# Patient Record
Sex: Female | Born: 1950
Health system: Southern US, Community
[De-identification: ages and names within clinical notes are randomized; demographics above are authoritative.]

## PROBLEM LIST (undated history)

## (undated) DIAGNOSIS — J309 Allergic rhinitis, unspecified: Secondary | ICD-10-CM

## (undated) DIAGNOSIS — I10 Essential (primary) hypertension: Secondary | ICD-10-CM

## (undated) DIAGNOSIS — E785 Hyperlipidemia, unspecified: Secondary | ICD-10-CM

## (undated) DIAGNOSIS — N183 Chronic kidney disease, stage 3 unspecified: Secondary | ICD-10-CM

## (undated) DIAGNOSIS — K219 Gastro-esophageal reflux disease without esophagitis: Secondary | ICD-10-CM

## (undated) DIAGNOSIS — F411 Generalized anxiety disorder: Secondary | ICD-10-CM

## (undated) DIAGNOSIS — E7800 Pure hypercholesterolemia, unspecified: Secondary | ICD-10-CM

## (undated) HISTORY — DX: Pure hypercholesterolemia, unspecified: E78.00

## (undated) HISTORY — DX: Allergic rhinitis, unspecified: J30.9

## (undated) HISTORY — DX: Gastro-esophageal reflux disease without esophagitis: K21.9

## (undated) HISTORY — DX: Generalized anxiety disorder: F41.1

## (undated) HISTORY — DX: Chronic kidney disease, stage 3 unspecified: N18.30

---

## 1998-12-04 ENCOUNTER — Encounter: Admission: RE | Admit: 1998-12-04 | Discharge: 1998-12-04 | Payer: Self-pay | Admitting: Family Medicine

## 1998-12-04 ENCOUNTER — Encounter: Payer: Self-pay | Admitting: Family Medicine

## 1999-11-27 ENCOUNTER — Other Ambulatory Visit: Admission: RE | Admit: 1999-11-27 | Discharge: 1999-11-27 | Payer: Self-pay | Admitting: Obstetrics & Gynecology

## 2014-05-06 ENCOUNTER — Other Ambulatory Visit: Payer: Self-pay | Admitting: Physician Assistant

## 2014-05-08 LAB — CYTOLOGY - PAP

## 2014-08-24 ENCOUNTER — Encounter (HOSPITAL_COMMUNITY): Payer: Self-pay | Admitting: *Deleted

## 2014-08-24 ENCOUNTER — Emergency Department (HOSPITAL_COMMUNITY)
Admission: EM | Admit: 2014-08-24 | Discharge: 2014-08-24 | Disposition: A | Discharge status: Home / Self Care | Payer: BC Managed Care – PPO | Attending: Emergency Medicine | Admitting: Emergency Medicine

## 2014-08-24 ENCOUNTER — Emergency Department (HOSPITAL_COMMUNITY): Payer: BC Managed Care – PPO

## 2014-08-24 DIAGNOSIS — E785 Hyperlipidemia, unspecified: Secondary | ICD-10-CM | POA: Diagnosis not present

## 2014-08-24 DIAGNOSIS — Z792 Long term (current) use of antibiotics: Secondary | ICD-10-CM | POA: Diagnosis not present

## 2014-08-24 DIAGNOSIS — R1030 Lower abdominal pain, unspecified: Secondary | ICD-10-CM | POA: Diagnosis present

## 2014-08-24 DIAGNOSIS — Z87440 Personal history of urinary (tract) infections: Secondary | ICD-10-CM | POA: Insufficient documentation

## 2014-08-24 DIAGNOSIS — G8929 Other chronic pain: Secondary | ICD-10-CM | POA: Insufficient documentation

## 2014-08-24 DIAGNOSIS — I10 Essential (primary) hypertension: Secondary | ICD-10-CM | POA: Diagnosis not present

## 2014-08-24 DIAGNOSIS — K5732 Diverticulitis of large intestine without perforation or abscess without bleeding: Secondary | ICD-10-CM

## 2014-08-24 DIAGNOSIS — R350 Frequency of micturition: Secondary | ICD-10-CM | POA: Diagnosis not present

## 2014-08-24 DIAGNOSIS — Z79899 Other long term (current) drug therapy: Secondary | ICD-10-CM | POA: Diagnosis not present

## 2014-08-24 HISTORY — DX: Hyperlipidemia, unspecified: E78.5

## 2014-08-24 HISTORY — DX: Essential (primary) hypertension: I10

## 2014-08-24 LAB — COMPREHENSIVE METABOLIC PANEL
ALT: 16 U/L (ref 14–54)
AST: 23 U/L (ref 15–41)
Albumin: 4 g/dL (ref 3.5–5.0)
Alkaline Phosphatase: 60 U/L (ref 38–126)
Anion gap: 7 (ref 5–15)
BUN: 16 mg/dL (ref 6–20)
CALCIUM: 8.8 mg/dL — AB (ref 8.9–10.3)
CO2: 25 mmol/L (ref 22–32)
CREATININE: 1.19 mg/dL — AB (ref 0.44–1.00)
Chloride: 100 mmol/L — ABNORMAL LOW (ref 101–111)
GFR calc non Af Amer: 47 mL/min — ABNORMAL LOW (ref >60)
GFR, EST AFRICAN AMERICAN: 55 mL/min — AB (ref >60)
Glucose, Bld: 120 mg/dL — ABNORMAL HIGH (ref 65–99)
Potassium: 3.3 mmol/L — ABNORMAL LOW (ref 3.5–5.1)
Sodium: 132 mmol/L — ABNORMAL LOW (ref 135–145)
Total Bilirubin: 1.1 mg/dL (ref 0.3–1.2)
Total Protein: 7.5 g/dL (ref 6.5–8.1)

## 2014-08-24 LAB — CBC WITH DIFFERENTIAL/PLATELET
BASOS PCT: 0 % (ref 0–1)
Basophils Absolute: 0 10*3/uL (ref 0.0–0.1)
Eosinophils Absolute: 0.1 10*3/uL (ref 0.0–0.7)
Eosinophils Relative: 1 % (ref 0–5)
HEMATOCRIT: 38.1 % (ref 36.0–46.0)
HEMOGLOBIN: 13.1 g/dL (ref 12.0–15.0)
LYMPHS ABS: 0.9 10*3/uL (ref 0.7–4.0)
Lymphocytes Relative: 8 % — ABNORMAL LOW (ref 12–46)
MCH: 29.5 pg (ref 26.0–34.0)
MCHC: 34.4 g/dL (ref 30.0–36.0)
MCV: 85.8 fL (ref 78.0–100.0)
MONOS PCT: 7 % (ref 3–12)
Monocytes Absolute: 0.7 10*3/uL (ref 0.1–1.0)
NEUTROS ABS: 8.8 10*3/uL — AB (ref 1.7–7.7)
Neutrophils Relative %: 84 % — ABNORMAL HIGH (ref 43–77)
Platelets: 169 10*3/uL (ref 150–400)
RBC: 4.44 MIL/uL (ref 3.87–5.11)
RDW: 13.5 % (ref 11.5–15.5)
WBC: 10.5 10*3/uL (ref 4.0–10.5)

## 2014-08-24 LAB — URINALYSIS, ROUTINE W REFLEX MICROSCOPIC
Bilirubin Urine: NEGATIVE
Glucose, UA: NEGATIVE mg/dL
HGB URINE DIPSTICK: NEGATIVE
KETONES UR: NEGATIVE mg/dL
LEUKOCYTES UA: NEGATIVE
NITRITE: NEGATIVE
Protein, ur: NEGATIVE mg/dL
Specific Gravity, Urine: 1.025 (ref 1.005–1.030)
Urobilinogen, UA: 0.2 mg/dL (ref 0.0–1.0)
pH: 6 (ref 5.0–8.0)

## 2014-08-24 MED ORDER — CIPROFLOXACIN HCL 500 MG PO TABS: 500.0000 mg | ORAL_TABLET | Freq: Two times a day (BID) | ORAL | Status: DC

## 2014-08-24 MED ORDER — METRONIDAZOLE 500 MG PO TABS
500.0000 mg | ORAL_TABLET | Freq: Two times a day (BID) | ORAL | Status: DC
Start: 2014-08-24 — End: 2021-04-17

## 2014-08-24 MED ORDER — SODIUM CHLORIDE 0.9 % IV BOLUS (SEPSIS)
1000.0000 mL | Freq: Once | INTRAVENOUS | Status: AC
  Administered 2014-08-24: 1000 mL via INTRAVENOUS

## 2014-08-24 MED ORDER — ONDANSETRON 4 MG PO TBDP: ORAL_TABLET | ORAL | Status: DC

## 2014-08-24 NOTE — Discharge Instructions (Signed)

## 2014-08-24 NOTE — ED Provider Notes (Signed)
CSN: [id number]     Arrival date & time 08/24/14  4098 History   First MD Initiated Contact with Patient 08/24/14 [id number]     Chief Complaint  Patient presents with  . Abdominal Pain     (Consider location/radiation/quality/duration/timing/severity/associated sxs/prior Treatment) HPI Comments: [name] She was seen by her PCP on Thursday which was 2 days ago. She was diagnosed with a urinary tract infection and was started on Cipro. She reports no improvement of symptoms. She states her symptoms initially started as urinary frequency. She denies any burning on urination. She denies any vaginal bleeding or discharge. She is postmenopausal. She states that her urinary frequency has improved but her lower abdominal pain has increased in intensity. It now radiates across both sides. She has some mild pain in her lower back but she feels that it's more chronic in nature. She denies any nausea vomiting or fevers. She denies any past surgical history.  [name]  Abdominal Pain Associated symptoms: no chest pain, no chills, no cough, no diarrhea, no fatigue, no fever, no hematuria, no nausea, no shortness of breath, no vaginal bleeding, no vaginal discharge and no vomiting     Past Medical History  Diagnosis Date  . Hypertension   . Hyperlipemia    History reviewed. No pertinent past surgical history. No family history on file. History  Substance Use Topics  . Smoking status: Never Smoker   . Smokeless tobacco: Not on file  . Alcohol Use: Yes     Comment: rarely   OB History    No data available     Review of Systems  Constitutional: Negative for fever, chills, diaphoresis and fatigue.  HENT: Negative for congestion, rhinorrhea and sneezing.   Eyes: Negative.   Respiratory: Negative for cough, chest tightness and shortness of breath.   Cardiovascular: Negative  for chest pain and leg swelling.  Gastrointestinal: Positive for abdominal pain. Negative for nausea, vomiting, diarrhea and blood in stool.  Genitourinary: Positive for frequency. Negative for hematuria, flank pain, vaginal bleeding, vaginal discharge and difficulty urinating.  Musculoskeletal: Negative for back pain and arthralgias.  Skin: Negative for rash.  Neurological: Negative for dizziness, speech difficulty, weakness, numbness and headaches.      Allergies  Review of patient's allergies indicates no known allergies.  Home Medications   Prior to Admission medications   Medication Sig Start Date End Date Taking? Authorizing Provider  atenolol (TENORMIN) 50 MG tablet Take 50 mg by mouth every morning.   Yes Historical Provider, MD  atorvastatin (LIPITOR) 20 MG tablet Take 20 mg by mouth daily. 06/06/14  Yes Historical Provider, MD  citalopram (CELEXA) 20 MG tablet Take 20 mg by mouth every morning.  07/10/14  Yes Historical Provider, MD  lisinopril (PRINIVIL,ZESTRIL) 10 MG tablet Take 10 mg by mouth every morning.  06/04/14  Yes Historical Provider, MD  pantoprazole (PROTONIX) 40 MG tablet Take 40 mg by mouth daily as needed (for heartburn).  08/22/14  Yes Historical Provider, MD  sulfamethoxazole-trimethoprim (BACTRIM DS,SEPTRA DS) 800-160 MG per tablet Take 1 tablet by mouth 2 (two) times daily.  08/22/14  Yes Historical Provider, MD  ciprofloxacin (CIPRO) 500 MG tablet Take 1 tablet (500 mg total) by mouth 2 (two) times daily. One po bid x 7 days 08/24/14   Rolan Bucco, MD  metroNIDAZOLE (FLAGYL) 500 MG tablet Take 1 tablet (500 mg total) by mouth 2 (  two) times daily. One po bid x 7 days 08/24/14   Rolan Bucco, MD  ondansetron (ZOFRAN ODT) 4 MG disintegrating tablet 4mg  ODT q4 hours prn nausea/vomit 08/24/14   Rolan Bucco, MD   BP 131/80 mmHg  Pulse 75  Temp(Src) 98.1 F (36.7 C) (Oral)  Resp 14  SpO2 98% Physical Exam  Constitutional: She is oriented to person, place, and time.  She appears well-developed and well-nourished.  HENT:  Head: Normocephalic and atraumatic.  Eyes: Pupils are equal, round, and reactive to light.  Neck: Normal range of motion. Neck supple.  Cardiovascular: Normal rate, regular rhythm and normal heart sounds.   Pulmonary/Chest: Effort normal and breath sounds normal. No respiratory distress. She has no wheezes. She has no rales. She exhibits no tenderness.  Abdominal: Soft. Bowel sounds are normal. There is tenderness (moderate tenderness to suprapubic area with mild tenderness across the lower abdomen bilaterally, no CVA tenderness). There is no rebound and no guarding.  Musculoskeletal: Normal range of motion. She exhibits no edema.  Lymphadenopathy:    She has no cervical adenopathy.  Neurological: She is alert and oriented to person, place, and time.  Skin: Skin is warm and dry. No rash noted.  Psychiatric: She has a normal mood and affect.    ED Course  Procedures (including critical care time) Labs Review Labs Reviewed  COMPREHENSIVE METABOLIC PANEL - Abnormal; Notable for the following:    Sodium 132 (*)    Potassium 3.3 (*)    Chloride 100 (*)    Glucose, Bld 120 (*)    Creatinine, Ser 1.19 (*)    Calcium 8.8 (*)    GFR calc non Af Amer 47 (*)    GFR calc Af Amer 55 (*)    All other components within normal limits  CBC WITH DIFFERENTIAL/PLATELET - Abnormal; Notable for the following:    Neutrophils Relative % 84 (*)    Neutro Abs 8.8 (*)    Lymphocytes Relative 8 (*)    All other components within normal limits  URINE CULTURE  URINALYSIS, ROUTINE W REFLEX MICROSCOPIC (NOT AT [facility])    Imaging Review Ct Renal Stone Study  08/24/2014   CLINICAL DATA:  Recent UTI with persistent urgency  EXAM: CT ABDOMEN AND PELVIS WITHOUT CONTRAST  TECHNIQUE: Multidetector CT imaging of the abdomen and pelvis was performed following the standard protocol without IV contrast.  COMPARISON:  None.  FINDINGS: Lung bases are free of acute  infiltrate or sizable effusion.  The liver, gallbladder, spleen, adrenal glands and pancreas are within normal limits. The kidneys are well visualized bilaterally without renal calculi or obstructive change. The bladder is decompressed.  Prominent left ovarian cyst is noted measuring 4.6 cm. The appendix is within normal limits. No free pelvic fluid is noted.  Significant inflammatory changes are noted surrounding the sigmoid colon with evidence of diverticular change. No focal perforation is identified. These changes are consistent with diverticulitis.  IMPRESSION: Left ovarian cyst.  Diverticulitis in the distal sigmoid colon.   Electronically Signed   By: Alcide Clever M.D.   On: 08/24/2014 09:43     EKG Interpretation None      MDM   Final diagnoses:  Diverticulitis of large intestine without perforation or abscess without bleeding    Patient presents with abdominal pain. Her urine does not look infected. There is no kidney stone. She does have evidence of diverticulitis. I will start her on Cipro and Flagyl as well as some Zofran for nausea. She's  afebrile and otherwise well-appearing. She does have some elevation in her creatinine and diminished kidney function however she says this is chronic. She does states a little bit worse than her normal as her GFR was last 59. She has some mild hyponatremia and hypokalemia. I encouraged her to have a follow-up visit this week with her primary care physician. Return cautions were given.    Rolan Bucco, MD 08/24/14 1024

## 2014-08-24 NOTE — ED Notes (Signed)
Pt started on cipro Thurs night for UTI Dx by PCP. Sts lower abd pain since Thursday as well. Frequency and urgency have resolved but not the pain.

## 2014-08-25 LAB — URINE CULTURE: Culture: 3000

## 2015-06-16 ENCOUNTER — Other Ambulatory Visit: Payer: Self-pay | Admitting: Physician Assistant

## 2016-02-12 ENCOUNTER — Other Ambulatory Visit: Payer: Self-pay | Admitting: Physician Assistant

## 2016-02-12 DIAGNOSIS — R5381 Other malaise: Secondary | ICD-10-CM

## 2016-02-16 ENCOUNTER — Other Ambulatory Visit: Payer: Self-pay | Admitting: Physician Assistant

## 2016-02-16 DIAGNOSIS — Z1231 Encounter for screening mammogram for malignant neoplasm of breast: Secondary | ICD-10-CM

## 2016-02-16 DIAGNOSIS — E2839 Other primary ovarian failure: Secondary | ICD-10-CM

## 2017-05-25 ENCOUNTER — Ambulatory Visit (INDEPENDENT_AMBULATORY_CARE_PROVIDER_SITE_OTHER): Payer: BC Managed Care – PPO

## 2017-05-25 ENCOUNTER — Ambulatory Visit: Payer: BC Managed Care – PPO | Admitting: Podiatry

## 2017-05-25 ENCOUNTER — Encounter: Payer: Self-pay | Admitting: Podiatry

## 2017-05-25 DIAGNOSIS — M21619 Bunion of unspecified foot: Secondary | ICD-10-CM | POA: Diagnosis not present

## 2017-05-25 DIAGNOSIS — M7752 Other enthesopathy of left foot: Secondary | ICD-10-CM

## 2017-05-25 DIAGNOSIS — M779 Enthesopathy, unspecified: Secondary | ICD-10-CM

## 2017-05-25 MED ORDER — TRIAMCINOLONE ACETONIDE 10 MG/ML IJ SUSP
10.0000 mg | Freq: Once | INTRAMUSCULAR | Status: AC
  Administered 2017-05-25: 10 mg

## 2017-05-25 NOTE — Patient Instructions (Signed)

## 2017-05-26 NOTE — Progress Notes (Signed)
Subjective:   Patient ID: Robyn Hall, female   DOB: 67 y.o.   MRN: [id number]   HPI Patient presents stating she has had a chronic bunion deformity left which is been sore and is been going on for at least the last year making increasingly hard procedure comfortably.  Patient has tried shoe gear modification without relief along with soaks and patient currently does not smoke and likes to be active   Review of Systems  All other systems reviewed and are negative.       Objective:  Physical Exam  Constitutional: She appears well-developed and well-nourished.  Cardiovascular: Intact distal pulses.  Pulmonary/Chest: Effort normal.  Musculoskeletal: Normal range of motion.  Neurological: She is alert.  Skin: Skin is warm.  Nursing note and vitals reviewed.   Neurovascular status found to be intact muscle strength is adequate range of motion within normal limits with patient found to have redness and pain around the first metatarsal head left with fluid buildup around the joint surface and pain with palpation.  Patient is noted to have good digital perfusion is well oriented x3     Assessment:  Inflammatory bunion deformity left with capsulitis occurring around the joint with pain upon palpation and structural changes     Plan:  H&P x-rays reviewed and today I did inject the capsule 3 mg Kenalog 5 mg Xylocaine advised on wider shoes and soaks and instructed on reduced activity levels currently.  Discussed permanent correction long-term with structural correction of the deformity she wants this done but cannot do now due to her work schedule.  Patient will be seen back in the next several weeks to reevaluate see how she responded to injection treatment  X-ray indicates structural bunion deformity left with elevation of the intermetatarsal angle and large bump formation

## 2018-09-11 ENCOUNTER — Other Ambulatory Visit: Payer: Self-pay

## 2018-09-11 DIAGNOSIS — Z20822 Contact with and (suspected) exposure to covid-19: Secondary | ICD-10-CM

## 2018-09-12 LAB — NOVEL CORONAVIRUS, NAA: SARS-CoV-2, NAA: NOT DETECTED

## 2018-10-05 ENCOUNTER — Other Ambulatory Visit: Payer: Self-pay | Admitting: Physician Assistant

## 2018-10-05 DIAGNOSIS — J3489 Other specified disorders of nose and nasal sinuses: Secondary | ICD-10-CM

## 2018-10-12 ENCOUNTER — Ambulatory Visit
Admission: RE | Admit: 2018-10-12 | Discharge: 2018-10-12 | Disposition: A | Discharge status: Home / Self Care | Payer: Medicare Other | Source: Ambulatory Visit | Attending: Physician Assistant | Admitting: Physician Assistant

## 2018-10-12 ENCOUNTER — Other Ambulatory Visit: Payer: Self-pay

## 2018-10-12 DIAGNOSIS — J3489 Other specified disorders of nose and nasal sinuses: Secondary | ICD-10-CM

## 2018-11-02 DIAGNOSIS — H6993 Unspecified Eustachian tube disorder, bilateral: Secondary | ICD-10-CM

## 2018-11-02 DIAGNOSIS — H6983 Other specified disorders of Eustachian tube, bilateral: Secondary | ICD-10-CM | POA: Insufficient documentation

## 2018-11-02 HISTORY — DX: Unspecified eustachian tube disorder, bilateral: H69.93

## 2018-11-02 HISTORY — DX: Other specified disorders of eustachian tube, bilateral: H69.83

## 2018-12-22 ENCOUNTER — Other Ambulatory Visit: Payer: Self-pay

## 2018-12-22 DIAGNOSIS — Z20822 Contact with and (suspected) exposure to covid-19: Secondary | ICD-10-CM

## 2018-12-25 LAB — NOVEL CORONAVIRUS, NAA: SARS-CoV-2, NAA: NOT DETECTED

## 2019-03-12 ENCOUNTER — Ambulatory Visit: Payer: Self-pay

## 2019-03-12 ENCOUNTER — Ambulatory Visit: Payer: Medicare PPO | Attending: Internal Medicine

## 2019-03-12 DIAGNOSIS — Z23 Encounter for immunization: Secondary | ICD-10-CM | POA: Insufficient documentation

## 2019-03-12 NOTE — Progress Notes (Signed)
   Covid-19 Vaccination Clinic  Name:  Robyn Hall    MRN: [id number] DOB: 07-11-1950  03/12/2019  Robyn Hall was observed post Covid-19 immunization for 15 minutes without incidence. She was provided with Vaccine Information Sheet and instruction to access the V-Safe system.   Robyn Hall was instructed to call 911 with any severe reactions post vaccine: Marland Kitchen Difficulty breathing  . Swelling of your face and throat  . A fast heartbeat  . A bad rash all over your body  . Dizziness and weakness    Immunizations Administered    Name Date Dose VIS Date Route   Pfizer COVID-19 Vaccine 03/12/2019  6:12 PM 0.3 mL 01/12/2019 Intramuscular   Manufacturer: [employer], [employer]   Lot: [id number]   NDC: [id number]

## 2019-04-06 ENCOUNTER — Ambulatory Visit: Payer: Medicare PPO | Attending: Physician Assistant

## 2019-04-06 DIAGNOSIS — Z23 Encounter for immunization: Secondary | ICD-10-CM

## 2019-04-06 NOTE — Progress Notes (Signed)
   Covid-19 Vaccination Clinic  Name:  Robyn Hall    MRN: [id number] DOB: 1950-09-24  04/06/2019  [name] was observed post Covid-19 immunization for 15 minutes without incident. She was provided with Vaccine Information Sheet and instruction to access the V-Safe system.   [name] was instructed to call 911 with any severe reactions post vaccine: Marland Kitchen Difficulty breathing  . Swelling of face and throat  . A fast heartbeat  . A bad rash all over body  . Dizziness and weakness   Immunizations Administered    Name Date Dose VIS Date Route   Pfizer COVID-19 Vaccine 04/06/2019  6:19 PM 0.3 mL 01/12/2019 Intramuscular   Manufacturer: [employer], [employer]   Lot: [id number]   NDC: [address]-2

## 2019-05-22 DIAGNOSIS — C441191 Basal cell carcinoma of skin of left upper eyelid, including canthus: Secondary | ICD-10-CM | POA: Diagnosis not present

## 2019-06-08 DIAGNOSIS — L244 Irritant contact dermatitis due to drugs in contact with skin: Secondary | ICD-10-CM | POA: Diagnosis not present

## 2019-06-08 DIAGNOSIS — L905 Scar conditions and fibrosis of skin: Secondary | ICD-10-CM | POA: Diagnosis not present

## 2019-06-08 DIAGNOSIS — Z85828 Personal history of other malignant neoplasm of skin: Secondary | ICD-10-CM | POA: Diagnosis not present

## 2019-07-17 DIAGNOSIS — L814 Other melanin hyperpigmentation: Secondary | ICD-10-CM | POA: Diagnosis not present

## 2019-07-17 DIAGNOSIS — L821 Other seborrheic keratosis: Secondary | ICD-10-CM | POA: Diagnosis not present

## 2019-07-17 DIAGNOSIS — L810 Postinflammatory hyperpigmentation: Secondary | ICD-10-CM | POA: Diagnosis not present

## 2019-07-17 DIAGNOSIS — Z872 Personal history of diseases of the skin and subcutaneous tissue: Secondary | ICD-10-CM | POA: Diagnosis not present

## 2019-07-19 DIAGNOSIS — R399 Unspecified symptoms and signs involving the genitourinary system: Secondary | ICD-10-CM | POA: Diagnosis not present

## 2019-07-24 DIAGNOSIS — L728 Other follicular cysts of the skin and subcutaneous tissue: Secondary | ICD-10-CM | POA: Diagnosis not present

## 2019-08-23 DIAGNOSIS — Z13820 Encounter for screening for osteoporosis: Secondary | ICD-10-CM | POA: Diagnosis not present

## 2019-08-23 DIAGNOSIS — K219 Gastro-esophageal reflux disease without esophagitis: Secondary | ICD-10-CM | POA: Diagnosis not present

## 2019-08-23 DIAGNOSIS — E7800 Pure hypercholesterolemia, unspecified: Secondary | ICD-10-CM | POA: Diagnosis not present

## 2019-08-23 DIAGNOSIS — I129 Hypertensive chronic kidney disease with stage 1 through stage 4 chronic kidney disease, or unspecified chronic kidney disease: Secondary | ICD-10-CM | POA: Diagnosis not present

## 2019-08-23 DIAGNOSIS — Z0000 Encounter for general adult medical examination without abnormal findings: Secondary | ICD-10-CM | POA: Diagnosis not present

## 2019-08-23 DIAGNOSIS — Z1239 Encounter for other screening for malignant neoplasm of breast: Secondary | ICD-10-CM | POA: Diagnosis not present

## 2019-08-23 DIAGNOSIS — N1831 Chronic kidney disease, stage 3a: Secondary | ICD-10-CM | POA: Diagnosis not present

## 2019-08-23 DIAGNOSIS — Z1159 Encounter for screening for other viral diseases: Secondary | ICD-10-CM | POA: Diagnosis not present

## 2019-08-23 DIAGNOSIS — F419 Anxiety disorder, unspecified: Secondary | ICD-10-CM | POA: Diagnosis not present

## 2019-09-06 DIAGNOSIS — L728 Other follicular cysts of the skin and subcutaneous tissue: Secondary | ICD-10-CM | POA: Diagnosis not present

## 2019-09-17 DIAGNOSIS — Z20822 Contact with and (suspected) exposure to covid-19: Secondary | ICD-10-CM | POA: Diagnosis not present

## 2019-09-24 DIAGNOSIS — Z20822 Contact with and (suspected) exposure to covid-19: Secondary | ICD-10-CM | POA: Diagnosis not present

## 2019-10-09 DIAGNOSIS — Z20822 Contact with and (suspected) exposure to covid-19: Secondary | ICD-10-CM | POA: Diagnosis not present

## 2019-10-29 DIAGNOSIS — H5319 Other subjective visual disturbances: Secondary | ICD-10-CM | POA: Diagnosis not present

## 2019-11-26 DIAGNOSIS — L821 Other seborrheic keratosis: Secondary | ICD-10-CM | POA: Diagnosis not present

## 2019-11-26 DIAGNOSIS — L578 Other skin changes due to chronic exposure to nonionizing radiation: Secondary | ICD-10-CM | POA: Diagnosis not present

## 2019-11-26 DIAGNOSIS — D1801 Hemangioma of skin and subcutaneous tissue: Secondary | ICD-10-CM | POA: Diagnosis not present

## 2019-11-26 DIAGNOSIS — L814 Other melanin hyperpigmentation: Secondary | ICD-10-CM | POA: Diagnosis not present

## 2020-01-29 DIAGNOSIS — H524 Presbyopia: Secondary | ICD-10-CM | POA: Diagnosis not present

## 2020-02-22 ENCOUNTER — Other Ambulatory Visit: Payer: Self-pay | Admitting: Physician Assistant

## 2020-02-22 DIAGNOSIS — Z1231 Encounter for screening mammogram for malignant neoplasm of breast: Secondary | ICD-10-CM

## 2020-02-22 DIAGNOSIS — E7800 Pure hypercholesterolemia, unspecified: Secondary | ICD-10-CM | POA: Diagnosis not present

## 2020-02-22 DIAGNOSIS — K219 Gastro-esophageal reflux disease without esophagitis: Secondary | ICD-10-CM | POA: Diagnosis not present

## 2020-02-22 DIAGNOSIS — I129 Hypertensive chronic kidney disease with stage 1 through stage 4 chronic kidney disease, or unspecified chronic kidney disease: Secondary | ICD-10-CM | POA: Diagnosis not present

## 2020-02-22 DIAGNOSIS — F419 Anxiety disorder, unspecified: Secondary | ICD-10-CM | POA: Diagnosis not present

## 2020-02-22 DIAGNOSIS — N183 Chronic kidney disease, stage 3 unspecified: Secondary | ICD-10-CM | POA: Diagnosis not present

## 2020-04-07 ENCOUNTER — Ambulatory Visit
Admission: RE | Admit: 2020-04-07 | Discharge: 2020-04-07 | Disposition: A | Discharge status: Home / Self Care | Payer: Medicare PPO | Source: Ambulatory Visit | Attending: Physician Assistant | Admitting: Physician Assistant

## 2020-04-07 ENCOUNTER — Other Ambulatory Visit: Payer: Self-pay

## 2020-04-07 DIAGNOSIS — Z1231 Encounter for screening mammogram for malignant neoplasm of breast: Secondary | ICD-10-CM

## 2020-09-24 DIAGNOSIS — H2513 Age-related nuclear cataract, bilateral: Secondary | ICD-10-CM | POA: Diagnosis not present

## 2020-10-07 DIAGNOSIS — Z0000 Encounter for general adult medical examination without abnormal findings: Secondary | ICD-10-CM | POA: Diagnosis not present

## 2020-10-07 DIAGNOSIS — Z23 Encounter for immunization: Secondary | ICD-10-CM | POA: Diagnosis not present

## 2020-10-07 DIAGNOSIS — E7800 Pure hypercholesterolemia, unspecified: Secondary | ICD-10-CM | POA: Diagnosis not present

## 2020-10-07 DIAGNOSIS — Z1211 Encounter for screening for malignant neoplasm of colon: Secondary | ICD-10-CM | POA: Diagnosis not present

## 2020-10-07 DIAGNOSIS — N183 Chronic kidney disease, stage 3 unspecified: Secondary | ICD-10-CM | POA: Diagnosis not present

## 2020-10-07 DIAGNOSIS — F419 Anxiety disorder, unspecified: Secondary | ICD-10-CM | POA: Diagnosis not present

## 2020-10-07 DIAGNOSIS — I129 Hypertensive chronic kidney disease with stage 1 through stage 4 chronic kidney disease, or unspecified chronic kidney disease: Secondary | ICD-10-CM | POA: Diagnosis not present

## 2020-10-07 DIAGNOSIS — K219 Gastro-esophageal reflux disease without esophagitis: Secondary | ICD-10-CM | POA: Diagnosis not present

## 2020-10-07 DIAGNOSIS — J309 Allergic rhinitis, unspecified: Secondary | ICD-10-CM | POA: Diagnosis not present

## 2020-10-09 DIAGNOSIS — Z01818 Encounter for other preprocedural examination: Secondary | ICD-10-CM | POA: Diagnosis not present

## 2020-10-09 DIAGNOSIS — H2512 Age-related nuclear cataract, left eye: Secondary | ICD-10-CM | POA: Diagnosis not present

## 2020-10-14 ENCOUNTER — Encounter: Payer: Self-pay | Admitting: Psychology

## 2020-10-31 DIAGNOSIS — H2512 Age-related nuclear cataract, left eye: Secondary | ICD-10-CM | POA: Diagnosis not present

## 2020-11-17 DIAGNOSIS — H2511 Age-related nuclear cataract, right eye: Secondary | ICD-10-CM | POA: Diagnosis not present

## 2020-11-19 ENCOUNTER — Ambulatory Visit (INDEPENDENT_AMBULATORY_CARE_PROVIDER_SITE_OTHER): Payer: Medicare PPO

## 2020-11-19 ENCOUNTER — Ambulatory Visit: Payer: Medicare PPO | Admitting: Podiatry

## 2020-11-19 ENCOUNTER — Encounter: Payer: Self-pay | Admitting: Podiatry

## 2020-11-19 ENCOUNTER — Other Ambulatory Visit: Payer: Self-pay

## 2020-11-19 DIAGNOSIS — M7752 Other enthesopathy of left foot: Secondary | ICD-10-CM | POA: Diagnosis not present

## 2020-11-19 DIAGNOSIS — M79672 Pain in left foot: Secondary | ICD-10-CM

## 2020-11-19 DIAGNOSIS — M21619 Bunion of unspecified foot: Secondary | ICD-10-CM

## 2020-11-19 DIAGNOSIS — M7750 Other enthesopathy of unspecified foot: Secondary | ICD-10-CM

## 2020-11-19 MED ORDER — TRIAMCINOLONE ACETONIDE 10 MG/ML IJ SUSP
10.0000 mg | Freq: Once | INTRAMUSCULAR | Status: AC
  Administered 2020-11-19: 10 mg

## 2020-11-19 NOTE — Progress Notes (Signed)
Subjective:   Patient ID: Robyn Hall, female   DOB: 70 y.o.   MRN: [id number]   HPI Patient presents stating she is developed a lot of pain on top of her left foot and its been very sore and making it hard to walk with swelling.  Does not remember specific injury states its been present around a month and does have bunion deformity.  Patient does not smoke likes to be active   Review of Systems  All other systems reviewed and are negative.      Objective:  Physical Exam Vitals and nursing note reviewed.  Constitutional:      Appearance: She is well-developed.  Pulmonary:     Effort: Pulmonary effort is normal.  Musculoskeletal:        General: Normal range of motion.  Skin:    General: Skin is warm.  Neurological:     Mental Status: She is alert.    Neurovascular status found to be intact muscle strength was found to be adequate range of motion adequate.  Inflammation fluid around the second MPJ left that is very tender when pressed with structural bunion deformity noted left over right with mild to moderate discomfort.  Good digital perfusion well oriented x3     Assessment:  Inflammatory capsulitis second MPJ left with possibility for systemic cause but most likely low local along with bunion deformity left over right     Plan:  H&P conditions reviewed.  Today I went ahead and I discussed the second MPJ I anesthetized 60 mg like Marcaine mixture aspirated the joint getting out a small amount of a mostly clear fluid with slight golden look to it and injected with quarter cc dexamethasone Kenalog apply thick plantar padding discussed other treatments depending on response and patient will be seen back  X-rays were negative for signs of fracture or other bony pathology with structural bunion deformity noted left

## 2020-11-26 ENCOUNTER — Other Ambulatory Visit: Payer: Self-pay | Admitting: Podiatry

## 2020-11-26 DIAGNOSIS — M7750 Other enthesopathy of unspecified foot: Secondary | ICD-10-CM

## 2020-12-12 ENCOUNTER — Telehealth: Payer: Self-pay | Admitting: *Deleted

## 2020-12-12 ENCOUNTER — Ambulatory Visit: Payer: Medicare PPO | Admitting: Podiatry

## 2020-12-12 ENCOUNTER — Other Ambulatory Visit: Payer: Self-pay

## 2020-12-12 ENCOUNTER — Encounter: Payer: Self-pay | Admitting: Podiatry

## 2020-12-12 DIAGNOSIS — M21619 Bunion of unspecified foot: Secondary | ICD-10-CM | POA: Diagnosis not present

## 2020-12-12 DIAGNOSIS — M7752 Other enthesopathy of left foot: Secondary | ICD-10-CM

## 2020-12-12 MED ORDER — DICLOFENAC SODIUM 75 MG PO TBEC: 75.0000 mg | DELAYED_RELEASE_TABLET | Freq: Two times a day (BID) | ORAL | 2 refills | Status: DC

## 2020-12-12 NOTE — Telephone Encounter (Signed)
Patient is calling and wanted to ask if she is supposed to be wearing the boot that she received today only while at home and until her next appointment.Please advise.

## 2020-12-15 NOTE — Telephone Encounter (Addendum)
Cam boot that was given, is she supposed to wear all the time except when sleeping and can she sometimes still wear her shoe? Please advise.

## 2020-12-15 NOTE — Progress Notes (Signed)
Subjective:   Patient ID: Robyn Hall, female   DOB: 70 y.o.   MRN: [id number]   HPI Patient presents stating she still having a lot of pain on top of her left foot and it almost feels like she is walking on a broken bone   ROS      Objective:  Physical Exam  Neurovascular status intact with patient's left second MPJ found to be inflamed with fluid buildup and there is discomfort in the metatarsal distal shaft itself with pain and inflammation     Assessment:  Inflammatory capsulitis with slight possibility stress fracture left second metatarsal that is not responded so far to medication offloading and shoe gear modification     Plan:  H&P reviewed condition.  Due to the intensity of discomfort and difficulty in walking normally I went ahead today and I applied an air fracture walker to mobilize.  I instructed on usage patient will be seen back 4 weeks ultimately could require osteotomy but hopefully this will resolve the symptoms she is experiencing

## 2020-12-17 NOTE — Telephone Encounter (Signed)
She should wear when she is walking

## 2020-12-17 NOTE — Telephone Encounter (Signed)
Returned the call to patient giving physician's instructions for wearing boot, verbalized understanding and she is wearing the bootand getting use to it.

## 2021-01-06 ENCOUNTER — Telehealth: Payer: Self-pay | Admitting: Podiatry

## 2021-01-06 NOTE — Telephone Encounter (Signed)
Dr. Eustace Moore called and wanted to speak to Dr. Charlsie Merles about this patient.  Please call Dr. Eustace Moore [phone]

## 2021-01-06 NOTE — Telephone Encounter (Signed)
Dr Eustace Moore office called - they said they have been trying to contact you for this patient ? The receptionist that called did not know what it is regarding.  They would like you to call either Dr Carolin Sicks cellphone at [phone] or office at [phone] ext [phone] .

## 2021-01-07 ENCOUNTER — Ambulatory Visit: Payer: Medicare PPO | Admitting: Podiatry

## 2021-01-16 ENCOUNTER — Encounter: Payer: Self-pay | Admitting: Podiatry

## 2021-01-16 ENCOUNTER — Ambulatory Visit: Payer: Medicare PPO | Admitting: Podiatry

## 2021-01-16 ENCOUNTER — Other Ambulatory Visit: Payer: Self-pay

## 2021-01-16 ENCOUNTER — Ambulatory Visit (INDEPENDENT_AMBULATORY_CARE_PROVIDER_SITE_OTHER): Payer: Medicare PPO

## 2021-01-16 DIAGNOSIS — M21619 Bunion of unspecified foot: Secondary | ICD-10-CM

## 2021-01-16 DIAGNOSIS — M7752 Other enthesopathy of left foot: Secondary | ICD-10-CM

## 2021-01-16 MED ORDER — TRIAMCINOLONE ACETONIDE 10 MG/ML IJ SUSP
10.0000 mg | Freq: Once | INTRAMUSCULAR | Status: AC
  Administered 2021-01-16: 10 mg

## 2021-01-17 LAB — SYNOVIAL FLUID ANALYSIS, COMPLETE
Basophils, %: 0 %
Eosinophils-Synovial: 2 % (ref 0–2)
Lymphocytes-Synovial Fld: 36 % (ref 0–74)
Monocyte/Macrophage: 5 % (ref 0–69)
Neutrophil, Synovial: 57 % — ABNORMAL HIGH (ref 0–24)
Synoviocytes, %: 0 % (ref 0–15)
WBC, Synovial: 373 cells/uL — ABNORMAL HIGH (ref <150)

## 2021-01-17 MED ORDER — DOXYCYCLINE HYCLATE 100 MG PO TABS: 100.0000 mg | ORAL_TABLET | Freq: Two times a day (BID) | ORAL | 1 refills | Status: DC

## 2021-01-17 NOTE — Progress Notes (Signed)
Subjective:   Patient ID: Robyn Hall, female   DOB: 70 y.o.   MRN: [id number]   HPI Patient presents stating this joint is still bothering her and she still has her bunion deformity and she is not sure if she needs surgery for this.  States the boot was only of moderate help for her in treating this   ROS      Objective:  Physical Exam  Neurovascular status was found to be intact with patient noted to have inflammation of the second MPJ left.  There is no erythema around the area or no other pathology with patient found to have significant structural bunion deformity also noted     Assessment:  Clinically this appears to be an inflammatory condition and continues to affect this joint and I am moderately frustrated that it did not respond to immobilization with structural bunion deformity noted     Plan:  H&P x-ray reviewed and discussed.  At this point I do think we should drain his joint 1 more time due to the persistent inflammation and I went ahead and I did a forefoot block I aspirated the joint and I did note it was a strawlike fluid color and not clear so I did go ahead and send it for synovial analysis and I did carefully injected quarter cc of dexamethasone into the joint to reduce what appears to be an inflammatory complex present.  I applied padding to reduce stress on the joint and I want to see back and we will wait for the results of the analysis and may require other treatments depending  X-rays indicate significant structural bunion deformity did not indicate any pathology around the second MPJ or changes at the joint

## 2021-02-13 ENCOUNTER — Ambulatory Visit: Payer: Medicare PPO | Admitting: Podiatry

## 2021-02-13 ENCOUNTER — Encounter: Payer: Self-pay | Admitting: Podiatry

## 2021-02-13 ENCOUNTER — Other Ambulatory Visit: Payer: Self-pay

## 2021-02-13 ENCOUNTER — Ambulatory Visit (INDEPENDENT_AMBULATORY_CARE_PROVIDER_SITE_OTHER): Payer: Medicare PPO

## 2021-02-13 DIAGNOSIS — M779 Enthesopathy, unspecified: Secondary | ICD-10-CM

## 2021-02-13 DIAGNOSIS — M79672 Pain in left foot: Secondary | ICD-10-CM | POA: Diagnosis not present

## 2021-02-13 MED ORDER — AMOXICILLIN-POT CLAVULANATE 875-125 MG PO TABS: 1.0000 | ORAL_TABLET | Freq: Two times a day (BID) | ORAL | 0 refills | Status: DC

## 2021-02-13 NOTE — Progress Notes (Signed)
Subjective:   Patient ID: Robyn Hall, female   DOB: 71 y.o.   MRN: [id number]   HPI Patient presents stating I think I am gradually improving but I still have some pain in my left foot   ROS      Objective:  Physical Exam  Neurovascular status found to be intact inflammation with some redness still noted around the second metatarsal phalangeal joint left with history of having had what appears to be bacterial infection of the capsule with patient having no history of penetrating injury.  She has not noted any redness or warmth around the joint     Assessment:  Unusual problem with probability for infection left second metatarsal phalangeal joint appears to be resolving but will need continued antibiotics     Plan:  H&P x-rays reviewed condition discussed at great length and I recommended soaks and I placed on Augmentin to try and see if this will get rid of any remaining symptoms and I want her to use this for 3 weeks.  She will use padding she will use her boot and will be seen back in strict instructions if any redness were to occur any swelling she is to reappoint immediately  X-rays were negative for signs of osteolysis or any bone changes associated with the pathology

## 2021-02-16 ENCOUNTER — Other Ambulatory Visit: Payer: Self-pay | Admitting: Podiatry

## 2021-02-16 DIAGNOSIS — M779 Enthesopathy, unspecified: Secondary | ICD-10-CM

## 2021-03-19 ENCOUNTER — Ambulatory Visit: Payer: Medicare PPO | Admitting: Podiatry

## 2021-03-26 ENCOUNTER — Encounter: Payer: Self-pay | Admitting: Podiatry

## 2021-03-26 ENCOUNTER — Ambulatory Visit (INDEPENDENT_AMBULATORY_CARE_PROVIDER_SITE_OTHER): Payer: Medicare PPO

## 2021-03-26 ENCOUNTER — Ambulatory Visit: Payer: Medicare PPO | Admitting: Podiatry

## 2021-03-26 ENCOUNTER — Other Ambulatory Visit: Payer: Self-pay

## 2021-03-26 DIAGNOSIS — M79672 Pain in left foot: Secondary | ICD-10-CM | POA: Diagnosis not present

## 2021-03-26 DIAGNOSIS — M7752 Other enthesopathy of left foot: Secondary | ICD-10-CM | POA: Diagnosis not present

## 2021-03-26 DIAGNOSIS — M21619 Bunion of unspecified foot: Secondary | ICD-10-CM

## 2021-03-27 ENCOUNTER — Encounter: Payer: Self-pay | Admitting: Psychology

## 2021-03-27 DIAGNOSIS — N183 Chronic kidney disease, stage 3 unspecified: Secondary | ICD-10-CM | POA: Insufficient documentation

## 2021-03-27 DIAGNOSIS — I10 Essential (primary) hypertension: Secondary | ICD-10-CM | POA: Insufficient documentation

## 2021-03-27 DIAGNOSIS — K219 Gastro-esophageal reflux disease without esophagitis: Secondary | ICD-10-CM | POA: Insufficient documentation

## 2021-03-27 DIAGNOSIS — J309 Allergic rhinitis, unspecified: Secondary | ICD-10-CM | POA: Insufficient documentation

## 2021-03-27 DIAGNOSIS — F411 Generalized anxiety disorder: Secondary | ICD-10-CM | POA: Insufficient documentation

## 2021-03-27 DIAGNOSIS — E7800 Pure hypercholesterolemia, unspecified: Secondary | ICD-10-CM | POA: Insufficient documentation

## 2021-03-27 NOTE — Progress Notes (Signed)
Subjective:   Patient ID: Robyn Hall, female   DOB: 71 y.o.   MRN: [id number]   HPI Patient presents stating that the left foot is improving but I still know it is there I have not noticed any swelling or redness   ROS      Objective:  Physical Exam  Neurovascular status intact with patient's left second MPJ showing still redness around the joint but improved from previous with significant reduction of discomfort swelling associated with it      Assessment:  What appears to be a possible infection of the joint second MPJ that appears to be resolving currently with diminished erythema edema and pain     Plan:  Reviewed condition I am very hopeful this will be the end of the problem and I did do precautionary x-ray today.  Patient will be seen as needed and continue with padding and rigid bottom shoes and will be seen back if symptoms worsen or persist over the next few months  X-rays indicate no signs of there is osteolysis no signs of a infective process of bone or soft tissue bubbling

## 2021-03-30 ENCOUNTER — Other Ambulatory Visit: Payer: Self-pay

## 2021-03-30 ENCOUNTER — Ambulatory Visit (INDEPENDENT_AMBULATORY_CARE_PROVIDER_SITE_OTHER): Payer: Medicare PPO | Admitting: Psychology

## 2021-03-30 ENCOUNTER — Ambulatory Visit: Payer: Medicare PPO | Admitting: Psychology

## 2021-03-30 ENCOUNTER — Encounter: Payer: Self-pay | Admitting: Psychology

## 2021-03-30 DIAGNOSIS — F411 Generalized anxiety disorder: Secondary | ICD-10-CM | POA: Diagnosis not present

## 2021-03-30 DIAGNOSIS — F0670 Mild neurocognitive disorder due to known physiological condition without behavioral disturbance: Secondary | ICD-10-CM

## 2021-03-30 DIAGNOSIS — G309 Alzheimer's disease, unspecified: Secondary | ICD-10-CM | POA: Diagnosis not present

## 2021-03-30 DIAGNOSIS — R4189 Other symptoms and signs involving cognitive functions and awareness: Secondary | ICD-10-CM

## 2021-03-30 HISTORY — DX: Alzheimer's disease, unspecified: G30.9

## 2021-03-30 HISTORY — DX: Mild neurocognitive disorder due to known physiological condition without behavioral disturbance: F06.70

## 2021-03-30 NOTE — Progress Notes (Signed)
° °  Psychometrician Note   Cognitive testing was administered to Robyn Hall by Wallace Keller, B.S. (psychometrist) under the supervision of Dr. Newman Nickels, Ph.D., licensed psychologist on 03/30/21. Robyn Hall did not appear overtly distressed by the testing session per behavioral observation or responses across self-report questionnaires. Rest breaks were offered.    The battery of tests administered was selected by Dr. Newman Nickels, Ph.D. with consideration to Robyn Hall's current level of functioning, the nature of her symptoms, emotional and behavioral responses during interview, level of literacy, observed level of motivation/effort, and the nature of the referral question. This battery was communicated to the psychometrist. Communication between Dr. Newman Nickels, Ph.D. and the psychometrist was ongoing throughout the evaluation and Dr. Newman Nickels, Ph.D. was immediately accessible at all times. Dr. Newman Nickels, Ph.D. provided supervision to the psychometrist on the date of this service to the extent necessary to assure the quality of all services provided.    Robyn Hall will return within approximately 1-2 weeks for an interactive feedback session with Dr. Milbert Coulter at which time her test performances, clinical impressions, and treatment recommendations will be reviewed in detail. Robyn Hall understands she can contact our office should she require our assistance before this time.  A total of 120 minutes of billable time were spent face-to-face with Robyn Hall by the psychometrist. This includes both test administration and scoring time. Billing for these services is reflected in the clinical report generated by Dr. Newman Nickels, Ph.D.  This note reflects time spent with the psychometrician and does not include test scores or any clinical interpretations made by Dr. Milbert Coulter. The full report will follow in a separate note.

## 2021-03-30 NOTE — Progress Notes (Addendum)
NEUROPSYCHOLOGICAL EVALUATION [place] Department of Neurology  Date of Evaluation: March 30, 2021  Reason for Referral:   Robyn Hall is a 71 y.o. left-handed Caucasian female referred by  Milus HeightNoelle Redmon, PA , to characterize her current cognitive functioning and assist with diagnostic clarity and treatment planning in the context of subjective cognitive decline.   Assessment and Plan:   Clinical Impression(s): Robyn Hall's pattern of performance is suggestive of severe impairment across all aspects of learning and memory. Additional impairment was exhibited across working memory, executive functioning, semantic fluency, and confrontation naming. Performance variability was exhibited across visuospatial abilities, while performances were appropriate relative to age-matched peers across processing speed, basic attention, and phonemic fluency. Robyn Hall denied difficulties completing instrumental activities of daily living (ADLs) independently. Her husband who was present during interview did not contradict this report. As such, given evidence for cognitive dysfunction described above, she meets criteria for a Mild Neurocognitive Disorder ("mild cognitive impairment"). However, given the extent of impairment, she is likely progressing towards the moderate to severe end of this spectrum and at risk of transitioning to a dementia designation in the next several years.   Regarding etiology, I unfortunately have particular concerns surrounding Alzheimer's disease. Across memory testing, Robyn Hall did not benefit from repeated learning trials and was essentially amnestic across all three verbal memory measures. Despite achieving a 75% retention score on a visual memory task, this task is a forced choice format rather than spontaneous provision, suggesting that Robyn Hall likely benefited from happenstance guessing. Performances across yes/no recognition trials  were quite poor. Altogether, this suggests the presence of rapid forgetting and a severe memory storage impairment, both of which all the hallmark memory characteristics of Alzheimer's disease. Further impairment in semantic fluency and confrontation naming are also expected in typical disease progression. As such, I believe this disease process to be the most likely cause for ongoing impairment at the present time.  Robyn Hall did report some anxiety at the onset of testing and has a history of generalized anxiety disorder. Anxiety can impact cognitive functioning, generally affecting attention/concentration. While these symptoms could make her presentation worse, I find it quite unlikely that acute anxiety would create impairment which would mimic a fairly classic Alzheimer's disease pattern. As such, ongoing impairment is believed to be above and beyond symptoms of anxiety alone. I do not have any recent neuroimaging to better understand any vascular contributions to her current presentation. She does not display behavioral symptoms concerning for Lewy body dementia, frontotemporal dementia, or a more rare parkinsonian presentation. Continued medical monitoring will be important moving forward.  Recommendations: I do not believe that Robyn Hall has seen or is currently followed by a neurologist. Given ongoing impairment, I will place a referral for her. When she meets with her neurologist, I would recommend that she discuss memory medications with her new clinician. It is important to highlight that these medications have been shown to slow functional decline in some individuals. There is no current treatment which can stop or reverse cognitive decline when caused by a neurodegenerative illness. Her neurologist may also wish to pursue neuroimaging (i.e., brain MRI) to better understand any possible anatomical correlates to ongoing cognitive decline.   A repeat neuropsychological evaluation in 18 months  (or sooner if functional decline is noted) is recommended. While the underlying diagnosis (i.e., Alzheimer's disease) is unlikely to change, repeat testing can allow for a better sense of trajectory. Future insurance documentation may  also require a formal dementia designation should she progress to that in the future.   Should there be further progression of current deficits over time, she is unlikely to regain any independent living skills lost. Therefore, it is recommended that she remain as involved as possible in all aspects of household chores, finances, and medication management, with supervision to ensure adequate performance. She will likely benefit from the establishment and maintenance of a routine in order to maximize her functional abilities over time.  It will be important for Robyn Hall to have another person with her when in situations where she may need to process information, weigh the pros and cons of different options, and make decisions, in order to ensure that she fully understands and recalls all information to be considered.  If not already done, Robyn Hall and her family may want to discuss her wishes regarding durable power of attorney and medical decision making, so that she can have input into these choices. Additionally, they may wish to discuss future plans for caretaking and seek out community options for in home/residential care should they become necessary.  Performance across neurocognitive testing is not a strong predictor of an individual's safety operating a motor vehicle. Should her family wish to pursue a formalized driving evaluation, they could reach out to the following agencies: [employer] in [place]: [phone] [facility]: [phone] [facility]: [phone] [facility]: [phone] or [phone]  Robyn Hall is encouraged to attend to lifestyle factors for brain health (e.g., regular physical  exercise, good nutrition habits, regular participation in cognitively-stimulating activities, and general stress management techniques), which are likely to have benefits for both emotional adjustment and cognition. Optimal control of vascular risk factors (including safe cardiovascular exercise and adherence to dietary recommendations) is encouraged. Continued participation in activities which provide mental stimulation and social interaction is also recommended.   Important information should be provided to Ms. Dilauro in written format in all instances. This information should be placed in a highly frequented and easily visible location within her home to promote recall. External strategies such as written notes in a consistently used memory journal, visual and nonverbal auditory cues such as a calendar on the refrigerator or appointments with alarm, such as on a cell phone, can also help maximize recall.  To address problems with processing speed, she may wish to consider:   -Ensuring that she is alerted when essential material or instructions are being presented   -Adjusting the speed at which new information is presented   -Allowing for more time in comprehending, processing, and responding in conversation  To address problems with fluctuating attention and executive dysfunction, she may wish to consider:   -Avoiding external distractions when needing to concentrate   -Limiting exposure to fast paced environments with multiple sensory demands   -Writing down complicated information and using checklists   -Attempting and completing one task at a time (i.e., no multi-tasking)   -Verbalizing aloud each step of a task to maintain focus   -Reducing the amount of information considered at one time  Review of Records:   Ms. Costin was seen by her PCP Altamease Oiler Redmon, PA) for her annual wellness visit on 10/07/2020. Per provided medical records, Ms. Halbert's daughter called Ms. Redmon's office on  09/08/2020 expressing concerns surrounding her mother's short-term memory. Her daughter described these difficulties as "nothing major" at that point in time, but did observe increased forgetfulness and difficulty processing information. She stated that her and other family  members had also observed Ms. Schiller forgetting things that were recently stated to her. During her appointment with Ms. Sherlyn Lick, Ms. Grisso acknowledged being a bit forgetful at times but felt that her family was making a bigger deal relative to what was warranted. Somewhat recent labs suggested very high cholesterol levels and it was recommended that her atorvastatin dosage be doubled back on 08/23/2019. Ultimately, Ms. Mcgovern was referred for a comprehensive neuropsychological evaluation to characterize her cognitive abilities and to assist with diagnostic clarity and treatment planning.   No neuroimaging was available for review.   Past Medical History:  Diagnosis Date   Allergic rhinitis    Chronic kidney disease, stage 3    Eustachian tube dysfunction, bilateral 11/02/2018   Gastro-esophageal reflux disease without esophagitis    Generalized anxiety disorder    High cholesterol    Hyperlipemia    Hypertension     No past surgical history on file.   Current Outpatient Medications:    amoxicillin-clavulanate (AUGMENTIN) 875-125 MG tablet, Take 1 tablet by mouth 2 (two) times daily., Disp: 40 tablet, Rfl: 0   atenolol (TENORMIN) 50 MG tablet, Take 50 mg by mouth every morning., Disp: , Rfl:    atorvastatin (LIPITOR) 20 MG tablet, Take 20 mg by mouth daily., Disp: , Rfl: 2   atorvastatin (LIPITOR) 20 MG tablet, Take 1 tablet by mouth daily., Disp: , Rfl:    ciprofloxacin (CIPRO) 500 MG tablet, Take 1 tablet (500 mg total) by mouth 2 (two) times daily. One po bid x 7 days, Disp: 14 tablet, Rfl: 0   citalopram (CELEXA) 20 MG tablet, Take 20 mg by mouth every morning. , Disp: , Rfl: 1   diclofenac (VOLTAREN) 75 MG EC tablet,  Take 1 tablet (75 mg total) by mouth 2 (two) times daily., Disp: 50 tablet, Rfl: 2   doxycycline (VIBRA-TABS) 100 MG tablet, Take 1 tablet (100 mg total) by mouth 2 (two) times daily., Disp: 20 tablet, Rfl: 1   famotidine (PEPCID) 20 MG tablet, 1 tablet at bedtime as needed, Disp: , Rfl:    hydrocortisone 2.5 % cream, Apply, Disp: , Rfl:    lisinopril (PRINIVIL,ZESTRIL) 10 MG tablet, Take 10 mg by mouth every morning. , Disp: , Rfl: 3   lisinopril (ZESTRIL) 10 MG tablet, TAKE 1 TABLET BY MOUTH EVERY DAY FOR BLOOD PRESSURE, Disp: , Rfl:    metroNIDAZOLE (FLAGYL) 500 MG tablet, Take 1 tablet (500 mg total) by mouth 2 (two) times daily. One po bid x 7 days, Disp: 14 tablet, Rfl: 0   ondansetron (ZOFRAN ODT) 4 MG disintegrating tablet, 4mg  ODT q4 hours prn nausea/vomit, Disp: 4 tablet, Rfl: 0   pantoprazole (PROTONIX) 40 MG tablet, Take 40 mg by mouth daily as needed (for heartburn). , Disp: , Rfl:    pantoprazole (PROTONIX) 40 MG tablet, 1 TABLET ONCE A DAY IF NEEDED ORALLY, Disp: , Rfl:    sulfamethoxazole-trimethoprim (BACTRIM DS,SEPTRA DS) 800-160 MG per tablet, Take 1 tablet by mouth 2 (two) times daily. , Disp: , Rfl:   Clinical Interview:   The following information was obtained during a clinical interview with Ms. Sanluis and her husband prior to cognitive testing.  Cognitive Symptoms: Decreased short-term memory: Denied. While Ms. Streiff acknowledged occasional instances where she might be more forgetful, she emphasized that these occurrences have "not been enough to bother me." Her husband also reported fairly mild instances where she might repeat herself or exhibited generalized forgetfulness.  Decreased long-term memory: Denied. Decreased attention/concentration:  Denied. Reduced processing speed: Denied. Difficulties with executive functions: Denied. She reported longstanding disorganization which was said to be stable. They denied trouble with impulsivity or any significant personality  changes.  Difficulties with emotion regulation: Denied. Difficulties with receptive language: Denied. Difficulties with word finding: Denied. Decreased visuoperceptual ability: Denied.  Trajectory of deficits: Per her husband, Ms. Nguyen went on a yearly beach vacation with her family about 12 months prior. During this trip, they reported that their daughter and her two sisters expressed concerns surrounding Ms. Profitt repeating herself and being more forgetful, to the point where the trip was not enjoyable and they two of them felt quite isolated. Her husband did acknowledge observing some very mild forgetfulness during the past 6-12 months. Stemming from this trip, her daughter called Ms. Ta's PCP "behind our backs" to express concerns surrounding memory due to fears that she would minimize difficulties and not receive a thorough evaluation. They acknowledged that this has created a "rift" in the family dynamic as of late.   Difficulties completing ADLs: Denied.  Additional Medical History: History of traumatic brain injury/concussion: Denied. History of stroke: Denied. History of seizure activity: Denied. History of known exposure to toxins: Denied. Symptoms of chronic pain: Denied. Experience of frequent headaches/migraines: Denied. Frequent instances of dizziness/vertigo: Denied.  Sensory changes: She had cataract surgery about 4-5 months ago and reported notably improved vision. She will wear glasses sporadically, particularly when watching television. Other sensory changes/difficulties (e.g., hearing, taste, or smell) were denied.  Balance/coordination difficulties: Denied. Other motor difficulties: Denied.  Sleep History: Estimated hours obtained each night: 7-8 hours.  Difficulties falling asleep: Denied. Difficulties staying asleep: Denied. Feels rested and refreshed upon awakening: Endorsed.  History of snoring: Denied. History of waking up gasping for air:  Denied. Witnessed breath cessation while asleep: Denied.  History of vivid dreaming: Denied. Excessive movement while asleep: Denied. Instances of acting out her dreams: Denied.  Psychiatric/Behavioral Health History: Depression: She described her current mood as "the same" and denied to her knowledge any prior concerns or formal diagnoses surrounding depression. There was a slight hesitation when stating this. Current or remote suicidal ideation, intent, or plan was denied.  Anxiety: Initially, she denied a history of anxiety. However, she eventually acknowledged taking an anti-anxiety medication (every now and then rather than daily) due to a history of mild, generalized symptoms. She did report acute anxiety surrounding testing procedures.  Mania: Denied. Trauma History: Denied. Visual/auditory hallucinations: Denied. Delusional thoughts: Denied.  Tobacco: Denied. Alcohol: She denied current alcohol consumption as well as a history of problematic alcohol abuse or dependence.  Recreational drugs: Denied.  Family History: History reviewed. No pertinent family history. This information was confirmed by Ms. Jefferys.  Academic/Vocational History: Highest level of educational attainment: 16 years. She completed high school and earned a Bachelor's degree in education. She described herself as a good (A/B) student in academic settings. No relative weaknesses were identified.  History of developmental delay: Denied. History of grade repetition: Denied. Enrollment in special education courses: Denied. History of LD/ADHD: Denied.  Employment: Retired. 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   Evaluation Results:   Behavioral Observations: Ms. Bastyr was accompanied by her husband, arrived to her appointment on time, and was appropriately dressed and groomed. She appeared alert  and oriented. Observed gait and station were within normal limits. Gross motor functioning appeared intact upon informal observation and  no abnormal movements (e.g., tremors) were noted. Her affect was generally positive, but did range appropriately given the subject being discussed during the clinical interview or the task at hand during testing procedures. She did express acute anxiety about upcoming testing procedures. Spontaneous speech was fluent and word finding difficulties were not observed during the clinical interview. Thought processes were coherent, organized, and normal in content. Insight into her cognitive difficulties appeared quite limited in that objective testing revealed significant impairment across several cognitive domains despite her denying all concerns.   She appeared nervous at the outset of testing. During testing, word finding difficulties were quite apparent across verbal tasks. She was unable to complete several more complex tasks (TMT B, D-KEFS Color Word) due to confusion/ongoing impairment. Confusion was also exhibited across other more simple tasks. For example, when asked to count the dots on a page, she made attempts to draw lines to connect the dots. Sustained attention was appropriate. Task engagement was adequate and she persisted when challenged. Overall, Ms. Bohanon was cooperative with the clinical interview and subsequent testing procedures.   Adequacy of Effort: The validity of neuropsychological testing is limited by the extent to which the individual being tested may be assumed to have exerted adequate effort during testing. Ms. Mao expressed her intention to perform to the best of her abilities and exhibited adequate task engagement and persistence. Scores across stand-alone and embedded performance validity measures were within expectation. As such, the results of the current evaluation are believed to be a valid representation of Ms. Mathew's current  cognitive functioning.  Test Results: Ms. Watkin was very poorly oriented at the time of the current evaluation. She was unable to state the current year ("2019"), month, date, day of the week, or name of the current clinic. When asked why she was present, she responded "because my husband insisted I come."   Intellectual abilities based upon educational and vocational attainment were estimated to be in the average range. Premorbid abilities were estimated to be within the above average range based upon a single-word reading test.   Processing speed was variable but overall adequate, ranging from the below average to well above average normative ranges. Basic attention was average. More complex attention (e.g., working memory) was well below average. Executive functioning was exceptionally low to well below average. She also scored in the well below average on a task assessing safety and judgment. Points were lost across this measure due to the provision of overly vague responses across the vast majority of questions rather than responses which were incorrect or unsafe.   Assessed receptive language abilities were exceptionally low. Ms. Donati exhibited trouble sequencing commands, understanding more complex sentence structure, and following multi-step commands. Assessed expressive language was well above average across phonemic fluency but exceptionally low across both semantic fluency and confrontation naming.     Assessed visuospatial/visuoconstructional abilities were variable, ranging from the exceptionally low to average normative ranges. She placed the numbers on her clock in counter-clockwise fashion and exhibited very mild spatial abnormalities. She also was only able to draw a single clock hand. Points were lost on her copy of a complex figure due to very mild visual distortions and her omitting one external aspect entirely.     Learning (i.e., encoding) of novel verbal and visual  information was exceptionally low to well below average. Spontaneous delayed recall (i.e., retrieval) of previously learned information was also exceptionally low to well below average. Retention rates were 17% (raw score of 1) across  a story learning task, 0% across a list learning task, and 75% (raw score of 3) across a shape learning task. Performance across recognition tasks was exceptionally low to well below average, suggesting negligible evidence for information consolidation.   Results of emotional screening instruments suggested that recent symptoms of generalized anxiety were in the mild range, while symptoms of depression were within normal limits. A screening instrument assessing recent sleep quality suggested the presence of minimal sleep dysfunction.  Tables of Scores:   Note: This summary of test scores accompanies the interpretive report and should not be considered in isolation without reference to the appropriate sections in the text. Descriptors are based on appropriate normative data and may be adjusted based on clinical judgment. Terms such as "Within Normal Limits" and "Outside Normal Limits" are used when a more specific description of the test score cannot be determined.       Percentile - Normative Descriptor > 98 - Exceptionally High 91-97 - Well Above Average 75-90 - Above Average 25-74 - Average 9-24 - Below Average 2-8 - Well Below Average < 2 - Exceptionally Low       Validity:   DESCRIPTOR       Dot Counting Test: --- --- Within Normal Limits  NAB EVI: --- --- Within Normal Limits       Orientation:      Raw Score Percentile   NAB Orientation, Form 1 19/29 --- ---       Cognitive Screening:      Raw Score Percentile   SLUMS: 12/30 --- ---       Intellectual Functioning:      Standard Score Percentile   Test of Premorbid Functioning: 117 87 Above Average       Memory:     NAB Memory Module, Form 1: Standard Score/ T Score Percentile   Total Memory  Index 50 <1 Exceptionally Low  List Learning       Total Trials 1-3 7/36 (19) <1 Exceptionally Low    List B 3/12 (39) 14 Below Average    Short Delay Free Recall 0/12 (19) <1 Exceptionally Low    Long Delay Free Recall 0/12 (20) <1 Exceptionally Low    Retention Percentage 0 (22) <1 Exceptionally Low    Recognition Discriminability 0 (30) 2 Well Below Average  Shape Learning       Total Trials 1-3 10/27 (31) 3 Well Below Average    Delayed Recall 3/9 (31) 3 Well Below Average    Retention Percentage 75 (43) 25 Average    Recognition Discriminability 3 (31) 3 Well Below Average  Story Learning       Immediate Recall 11/80 (19) <1 Exceptionally Low    Delayed Recall 1/40 (25) 1 Exceptionally Low    Retention Percentage 17 (19) <1 Exceptionally Low  Daily Living Memory       Immediate Recall 24/51 (22) <1 Exceptionally Low    Delayed Recall 1/17 (19) <1 Exceptionally Low    Retention Percentage 10 (<19) <1 Exceptionally Low    Recognition Hits 4/10 (<19) <1 Exceptionally Low       Attention/Executive Function:     Trail Making Test (TMT): Raw Score (T Score) Percentile     Part A 35 secs.,  0 errors (47) 38 Average    Part B Discontinued --- Impaired         Scaled Score Percentile   WAIS-IV Coding: 8 25 Average       NAB Attention Module,  Form 1: T Score Percentile     Digits Forward 45 31 Average    Digits Backwards 33 5 Well Below Average        Scaled Score Percentile   WAIS-IV Similarities: 5 5 Well Below Average       D-KEFS Color-Word Interference Test: Raw Score (Scaled Score) Percentile     Color Naming 42 secs. (7) 16 Below Average    Word Reading 17 secs. (14) 91 Well Above Average    Inhibition Discontinued --- Impaired    Inhibition/Switching Discontinued --- Impaired       NAB Executive Functions Module, Form 1: T Score Percentile     Judgment 35 7 Well Below Average       Language:     Verbal Fluency Test: Raw Score (T Score) Percentile     Phonemic  Fluency (FAS) 59 (65) 93 Well Above Average    Animal Fluency 7 (16) <1 Exceptionally Low        NAB Language Module, Form 1: T Score Percentile     Auditory Comprehension 19 <1 Exceptionally Low    Naming 15/31 (19) <1 Exceptionally Low       Visuospatial/Visuoconstruction:      Raw Score Percentile   Clock Drawing: 5/10 --- Impaired       NAB Spatial Module, Form 1: T Score Percentile     Figure Drawing Copy 39 14 Below Average        Scaled Score Percentile   WAIS-IV Block Design: 8 25 Average       Mood and Personality:      Raw Score Percentile   Beck Depression Inventory - II: 1 --- Within Normal Limits  PROMIS Anxiety Questionnaire: 18 --- Mild       Additional Questionnaires:      Raw Score Percentile   PROMIS Sleep Disturbance Questionnaire: 15 --- None to Slight   Informed Consent and Coding/Compliance:   The current evaluation represents a clinical evaluation for the purposes previously outlined by the referral source and is in no way reflective of a forensic evaluation.   Ms. Bienkowski was provided with a verbal description of the nature and purpose of the present neuropsychological evaluation. Also reviewed were the foreseeable risks and/or discomforts and benefits of the procedure, limits of confidentiality, and mandatory reporting requirements of this provider. The patient was given the opportunity to ask questions and receive answers about the evaluation. Oral consent to participate was provided by the patient.   This evaluation was conducted by Newman Nickels, Ph.D., ABPP-CN, board certified clinical neuropsychologist. Ms. Babler completed a clinical interview with Dr. Milbert Coulter, billed as one unit [phone], and 120 minutes of cognitive testing and scoring, billed as one unit [phone] and three additional units 96139. Psychometrist Wallace Keller, B.S., assisted Dr. Milbert Coulter with test administration and scoring procedures. As a separate and discrete service, Dr. Milbert Coulter spent a total  of 160 minutes in interpretation and report writing billed as one unit [phone] and two units 96133.

## 2021-04-01 ENCOUNTER — Telehealth: Payer: Self-pay | Admitting: Psychology

## 2021-04-01 NOTE — Telephone Encounter (Signed)
Yes, his name is Robyn Hall. ?

## 2021-04-01 NOTE — Telephone Encounter (Signed)
Sent this to pool, should have sent to [facility]. ?

## 2021-04-01 NOTE — Telephone Encounter (Signed)
Robyn Hall's husband would like a call to discuss some things before her actual appt Monday 04/06/21 for her results ?

## 2021-04-06 ENCOUNTER — Ambulatory Visit (INDEPENDENT_AMBULATORY_CARE_PROVIDER_SITE_OTHER): Payer: Medicare PPO | Admitting: Psychology

## 2021-04-06 ENCOUNTER — Other Ambulatory Visit: Payer: Self-pay

## 2021-04-06 DIAGNOSIS — F0670 Mild neurocognitive disorder due to known physiological condition without behavioral disturbance: Secondary | ICD-10-CM | POA: Diagnosis not present

## 2021-04-06 DIAGNOSIS — G309 Alzheimer's disease, unspecified: Secondary | ICD-10-CM

## 2021-04-06 NOTE — Progress Notes (Signed)
? ?  Neuropsychology Feedback Session ?[facility] ?[place] Department of Neurology ? ?Reason for Referral:  ? ?Robyn Hall is a 71 y.o. left-handed Caucasian female referred by  Lennie Odor, PA , to characterize her current cognitive functioning and assist with diagnostic clarity and treatment planning in the context of subjective cognitive decline.  ? ?Feedback:  ? ?Robyn Hall completed a comprehensive neuropsychological evaluation on 03/30/2021. Please refer to that encounter for the full report and recommendations. Briefly, results suggested severe impairment across all aspects of learning and memory. Additional impairment was exhibited across working memory, executive functioning, semantic fluency, and confrontation naming. Performance variability was exhibited across visuospatial abilities. I unfortunately have particular concerns surrounding Alzheimer's disease. Across memory testing, Robyn Hall did not benefit from repeated learning trials and was essentially amnestic across all three verbal memory measures. Despite achieving a 75% retention score on a visual memory task, this task is a forced choice format rather than spontaneous provision, suggesting that Robyn Hall likely benefited from happenstance guessing. Performances across yes/no recognition trials were quite poor. Altogether, this suggests the presence of rapid forgetting and a severe memory storage impairment, both of which all the hallmark memory characteristics of Alzheimer's disease. Further impairment in semantic fluency and confrontation naming are also expected in typical disease progression. ? ?Robyn Hall was accompanied by her husband during the current feedback session. Content of the current session focused on the results of her neuropsychological evaluation. Robyn Hall was given the opportunity to ask questions and her questions were answered. She was encouraged to reach out should additional questions  arise. A copy of her report was provided at the conclusion of the visit.  ? ?  ? ?25 minutes were spent conducting the current feedback session with Robyn Hall, billed as one unit [phone].  ?

## 2021-04-17 ENCOUNTER — Other Ambulatory Visit: Payer: Self-pay

## 2021-04-17 ENCOUNTER — Encounter: Payer: Self-pay | Admitting: Physician Assistant

## 2021-04-17 ENCOUNTER — Ambulatory Visit: Payer: Medicare PPO | Admitting: Physician Assistant

## 2021-04-17 ENCOUNTER — Other Ambulatory Visit (INDEPENDENT_AMBULATORY_CARE_PROVIDER_SITE_OTHER): Payer: Medicare PPO

## 2021-04-17 VITALS — BP 158/91 | HR 71 | Ht 62.0 in | Wt 134.2 lb

## 2021-04-17 DIAGNOSIS — F0670 Mild neurocognitive disorder due to known physiological condition without behavioral disturbance: Secondary | ICD-10-CM

## 2021-04-17 DIAGNOSIS — G309 Alzheimer's disease, unspecified: Secondary | ICD-10-CM | POA: Diagnosis not present

## 2021-04-17 MED ORDER — DONEPEZIL HCL 5 MG PO TABS: ORAL_TABLET | ORAL | 11 refills | Status: DC

## 2021-04-17 NOTE — Patient Instructions (Addendum)
It was a pleasure to see you today at our office.  ? ?Recommendations: ? ?Check labs today ?Follow up 6 month ?We will start donepezil, half tablet (2.5mg ) daily for 2  weeks.  If you are tolerating the medication, then after 2 weeks, we will increase the dose to a full tablet of 5 mg daily.  Side effects include nausea, vomiting, diarrhea, vivid dreams, and muscle cramps.  Please call the clinic if you experience any of these symptoms.  ?Consider CBT to help with any emotional issues ? ? ? ?RECOMMENDATIONS FOR ALL PATIENTS WITH MEMORY PROBLEMS: ?1. Continue to exercise (Recommend 30 minutes of walking everyday, or 3 hours every week) ?2. Increase social interactions - continue going to [place] and enjoy social gatherings with friends and family ?3. Eat healthy, avoid fried foods and eat more fruits and vegetables ?4. Maintain adequate blood pressure, blood sugar, and blood cholesterol level. Reducing the risk of stroke and cardiovascular disease also helps promoting better memory. ?5. Avoid stressful situations. Live a simple life and avoid aggravations. Organize your time and prepare for the next day in anticipation. ?6. Sleep well, avoid any interruptions of sleep and avoid any distractions in the bedroom that may interfere with adequate sleep quality ?7. Avoid sugar, avoid sweets as there is a strong link between excessive sugar intake, diabetes, and cognitive impairment ?We discussed the Mediterranean diet, which has been shown to help patients reduce the risk of progressive memory disorders and reduces cardiovascular risk. This includes eating fish, eat fruits and green leafy vegetables, nuts like almonds and hazelnuts, walnuts, and also use olive oil. Avoid fast foods and fried foods as much as possible. Avoid sweets and sugar as sugar use has been linked to worsening of memory function. ? ?There is always a concern of gradual progression of memory problems. If this is the case, then we may need to adjust  level of care according to patient needs. Support, both to the patient and caregiver, should then be put into place.  ? ? ? ? ?You have been referred for a neuropsychological evaluation (i.e., evaluation of memory and thinking abilities). Please bring someone with you to this appointment if possible, as it is helpful for the doctor to hear from both you and another adult who knows you well. Please bring eyeglasses and hearing aids if you wear them.  ?  ?The evaluation will take approximately 3 hours and has two parts: ?  ?The first part is a clinical interview with the neuropsychologist (Dr. Melvyn Novas or Dr. Nicole Kindred). During the interview, the neuropsychologist will speak with you and the individual you brought to the appointment.  ?  ?The second part of the evaluation is testing with the doctor's technician Hinton Dyer or Maudie Mercury). During the testing, the technician will ask you to remember different types of material, solve problems, and answer some questionnaires. Your family member will not be present for this portion of the evaluation. ?  ?Please note: We must reserve several hours of the neuropsychologist's time and the psychometrician's time for your evaluation appointment. As such, there is a No-Show fee of $100. If you are unable to attend any of your appointments, please contact our office as soon as possible to reschedule.  ? ? ?FALL PRECAUTIONS: Be cautious when walking. Scan the area for obstacles that may increase the risk of trips and falls. When getting up in the mornings, sit up at the edge of the bed for a few minutes before getting out of bed. Consider  elevating the bed at the head end to avoid drop of blood pressure when getting up. Walk always in a well-lit room (use night lights in the walls). Avoid area rugs or power cords from appliances in the middle of the walkways. Use a walker or a cane if necessary and consider physical therapy for balance exercise. Get your eyesight checked regularly. ? ?FINANCIAL  OVERSIGHT: Supervision, especially oversight when making financial decisions or transactions is also recommended. ? ?HOME SAFETY: Consider the safety of the kitchen when operating appliances like stoves, microwave oven, and blender. Consider having supervision and share cooking responsibilities until no longer able to participate in those. Accidents with firearms and other hazards in the house should be identified and addressed as well. ? ? ?ABILITY TO BE LEFT ALONE: If patient is unable to contact 911 operator, consider using LifeLine, or when the need is there, arrange for someone to stay with patients. Smoking is a fire hazard, consider supervision or cessation. Risk of wandering should be assessed by caregiver and if detected at any point, supervision and safe proof recommendations should be instituted. ? ?MEDICATION SUPERVISION: Inability to self-administer medication needs to be constantly addressed. Implement a mechanism to ensure safe administration of the medications. ? ? ?DRIVING: Regarding driving, in patients with progressive memory problems, driving will be impaired. We advise to have someone else do the driving if trouble finding directions or if minor accidents are reported. Independent driving assessment is available to determine safety of driving. ? ? ?If you are interested in the driving assessment, you can contact the following: ? ?[facility] [employer] [facility] in [place] ? ?[place] [phone] ? ?[facility] [phone] ? ?[facility] [phone] or [phone] ? ? ? ?Mediterranean Diet ?A Mediterranean diet refers to food and lifestyle choices that are based on the traditions of countries located on the [employer]. This way of eating has been shown to help prevent certain conditions and improve outcomes for people who have chronic diseases, like kidney disease and heart disease. ?What are tips for following this plan? ?Lifestyle  ?Cook  and eat meals together with your family, when possible. ?Drink enough fluid to keep your urine clear or pale yellow. ?Be physically active every day. This includes: ?Aerobic exercise like running or swimming. ?Leisure activities like gardening, walking, or housework. ?Get 7-8 hours of sleep each night. ?If recommended by your health care provider, drink red wine in moderation. This means 1 glass a day for nonpregnant women and 2 glasses a day for men. A glass of wine equals 5 oz (150 mL). ?Reading food labels  ?Check the serving size of packaged foods. For foods such as rice and pasta, the serving size refers to the amount of cooked product, not dry. ?Check the total fat in packaged foods. Avoid foods that have saturated fat or trans fats. ?Check the ingredients list for added sugars, such as corn syrup. ?Shopping  ?At the grocery store, buy most of your food from the areas near the walls of the store. This includes: ?Fresh fruits and vegetables (produce). ?Grains, beans, nuts, and seeds. Some of these may be available in unpackaged forms or large amounts (in bulk). ?Fresh seafood. ?Poultry and eggs. ?Low-fat dairy products. ?Buy whole ingredients instead of prepackaged foods. ?Buy fresh fruits and vegetables in-season from local farmers markets. ?Buy frozen fruits and vegetables in resealable bags. ?If you do not have access to quality fresh seafood, buy precooked frozen shrimp or canned fish, such as tuna, salmon,  or sardines. ?Buy small amounts of raw or cooked vegetables, salads, or olives from the deli or salad bar at your store. ?Stock your pantry so you always have certain foods on hand, such as olive oil, canned tuna, canned tomatoes, rice, pasta, and beans. ?Cooking  ?Cook foods with extra-virgin olive oil instead of using butter or other vegetable oils. ?Have meat as a side dish, and have vegetables or grains as your main dish. This means having meat in small portions or adding small amounts of meat to  foods like pasta or stew. ?Use beans or vegetables instead of meat in common dishes like chili or lasagna. ?Experiment with different cooking methods. Try roasting or broiling vegetables instead of steaming or

## 2021-04-17 NOTE — Progress Notes (Signed)
? ? ?Assessment/Plan:  ? ?Robyn Hall is a very pleasant 71 y.o. year old RH female with  a history of hypertension, hyperlipidemia, anxiety, depression seen today for evaluation of mild cognitive impairment likely due to Alzheimer's disease, per neuropsychological evaluation.  Patient is otherwise well, although she does not want to hear the word "Alzheimer's "during the discussion, as it creates significant anxiety.  We have been instructed to do so during a conversation with her.  Exam is normal.  The patient declines an MRI of the brain at this time.  Labs are pending. ? ? ? Recommendations:  ? ?Memory Loss  ? ?MRI brain with/without contrast to assess for underlying structural abnormality and assess vascular load has been recommended, patient declines, will let us know when she is ready to proceed.   ?Check B12, TSH ?Repeat neuropsychological evaluation in 18 months  ?Start Donepezil 5  mg :Take half tablet (2.5 mg) daily for 2 weeks, then increase to the full tablet at 5 mg daily. Side effects discussed.  She wants to start with the lowest dose at this time.   ?Discussed safety both in and out of the home.  ?Discussed the importance of regular daily schedule to maintain brain function.  ?Continue to monitor mood with PCP.  Continue Celexa 20 mg daily ?Monitor Driving ?Stay active at least 30 minutes at least 3 times a week.  ?Naps should be scheduled and should be no longer than 60 minutes and should not occur after 2 PM.  ?Control cardiovascular risk factors  ?Mediterranean diet is recommended  ?Folllow up in 6 months ? ?Subjective:  ? ? ?The patient is seen in neurologic consultation at the request of Rosann Auerbach, PhD for the evaluation of memory.  The patient is accompanied by  who supplements the history. ?This is a 71 y.o. year old RH  female who has had memory issues for about one year.  Per her husband, the patient went to a yearly beach vacation with her family, and during that trip, they  expressed concerns about her, as she was repeating herself, being more forgetful.  Her husband did acknowledge observing some very mild forgetfulness up to 6 months prior. Initially she minimized the symptoms.  Eventually she was seen by her PCP in September 2022, when she was referred to comprehensive neuropsychological evaluation to characterize her cognitive abilities and to diagnose her condition, and treatment plan. Even then, she denied her short-term memory deficits, stating that "they were not bad enough to bother me ".  She denies any depression, or irritability.  She denied initially any history of anxiety, but eventually acknowledged taking an antianxiety medication as needed, especially during testing procedures.  She denies any issues with long-term memory, attention or concentration, or processing speed.  She does report some lifelong disorganization, but this is unchanged from before.  There are no personality changes.  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 She denies any difficulties understanding, and she enjoys doing word finding and crossword puzzles.  She also enjoys singing in the car with her husband and denies forgetting any of the lyrics of songs.  She continues to drive and denies getting lost. Denies history of traumatic brain injury or concussion, stroke, seizures, exposure to toxins, chronic pain, frequent headaches or migraines, dizziness or vertigo. She sleeps about 7 to 8 hours, denies difficulties falling asleep, staying asleep, and she feels  rested upon waking up.  She denies history of snoring or history of sleep apnea.  She denies vivid dreams, REM behavior, hallucinations or paranoia.  She denies any anosmia.  Denies any urine incontinence or bowel complaints.  Denies alcohol or tobacco.  Family history negative for dementia. ? ? ?Neuropsych evaluation 03/30/2021 Dr. Milbert CoulterMerz briefly, results suggested  severe impairment across all aspects of learning and memory. Additional impairment was exhibited across working memory, executive functioning, semantic fluency, and confrontation naming. Performance variability was exhibited across visuospatial abilities. I unfortunately have particular concerns surrounding Alzheimer's disease. Across memory testing, Robyn Hall did not benefit from repeated learning trials and was essentially amnestic across all three verbal memory measures. Despite achieving a 75% retention score on a visual memory task, this task is a forced choice format rather than spontaneous provision, suggesting that Robyn Hall likely benefited from happenstance guessing. Performances across yes/no recognition trials were quite poor. Altogether, this suggests the presence of rapid forgetting and a severe memory storage impairment, both of which all the hallmark memory characteristics of Alzheimer's disease. Further impairment in semantic fluency and confrontation naming are also expected in typical disease progression. ? ?No Known Allergies ? ?Current Outpatient Medications  ?Medication Instructions  ? atenolol (TENORMIN) 50 mg, Oral, Every morning  ? atorvastatin (LIPITOR) 20 MG tablet 1 tablet, Oral, Daily  ? atorvastatin (LIPITOR) 20 mg, Oral, Daily  ? citalopram (CELEXA) 20 mg, Oral, Every morning  ? famotidine (PEPCID) 20 MG tablet 1 tablet at bedtime as needed  ? lisinopril (ZESTRIL) 10 MG tablet TAKE 1 TABLET BY MOUTH EVERY DAY FOR BLOOD PRESSURE  ? lisinopril (ZESTRIL) 10 mg, Oral, Every morning  ? pantoprazole (PROTONIX) 40 MG tablet 1 TABLET ONCE A DAY IF NEEDED ORALLY  ? pantoprazole (PROTONIX) 40 mg, Oral, Daily PRN  ? ? ? ?VITALS:   ?Vitals:  ? 04/17/21 1446  ?BP: (!) 158/91  ?Pulse: 71  ?SpO2: 99%  ?Weight: 134 lb 3.2 oz (60.9 kg)  ?Height: 5\' 2"  (1.575 m)  ? ?No flowsheet data found. ? ?PHYSICAL EXAM  ? ?HEENT:  Normocephalic, atraumatic. The mucous membranes are moist. The superficial  temporal arteries are without ropiness or tenderness. ?Cardiovascular: Regular rate and rhythm. ?Lungs: Clear to auscultation bilaterally. ?Neck: There are no carotid bruits noted bilaterally. ? ?NEUROLOGICAL: ?No flowsheet data found. ?No flowsheet data found.  ?No flowsheet data found.  ? ?Orientation:  Alert and oriented to person, does not recall the name of this place, date, year is 2019.  No aphasia or dysarthria. Fund of knowledge is appropriate. Recent memory impaired and remote memory intact.  Attention and concentration are normal.  Able to name objects and repeat phrases. ?Cranial nerves: There is good facial symmetry. Extraocular muscles are intact and visual fields are full to confrontational testing. Speech is fluent and clear. Soft palate rises symmetrically and there is no tongue deviation. Hearing is intact to conversational tone. ?Tone: Tone is good throughout. ?Sensation: Sensation is intact to light touch and pinprick throughout. Vibration is intact at the bilateral big toe.There is no extinction with double simultaneous stimulation. There is no sensory dermatomal level identified. ?Coordination: The patient has no difficulty with RAM's or FNF bilaterally. Normal finger to nose  ?Motor: Strength is 5/5 in the bilateral upper and lower extremities. There is no pronator drift. There are no fasciculations noted. ?DTR's: Deep tendon reflexes are 2/4 at the bilateral biceps, triceps, brachioradialis, patella and achilles.  Plantar responses are downgoing bilaterally. ?Gait and Station: The  patient is able to ambulate without difficulty.The patient is able to heel toe walk without any difficulty.The patient is able to ambulate in a tandem fashion. The patient is able to stand in the Romberg position. ?  ? ? ?Thank you for allowing [place] the opportunity to participate in the care of this nice patient. Please do not hesitate to contact us for any questions or concerns.  ? ?Total time spent on today's visit  was 60 minutes, including both face-to-face time and nonface-to-face time.  Time included that spent on review of records (prior notes available to me/labs/imaging if pertinent), discussing treatment and goals,

## 2021-04-18 LAB — TSH: TSH: 1.91 mIU/L (ref 0.40–4.50)

## 2021-04-18 LAB — VITAMIN B12: Vitamin B-12: 446 pg/mL (ref 200–1100)

## 2021-04-19 ENCOUNTER — Encounter: Payer: Self-pay | Admitting: Physician Assistant

## 2021-04-19 NOTE — Progress Notes (Signed)
Please inform patient all her labs ( B12 and TSH)  are normal. thanks

## 2021-04-21 ENCOUNTER — Telehealth: Payer: Self-pay | Admitting: Physician Assistant

## 2021-04-21 ENCOUNTER — Other Ambulatory Visit: Payer: Self-pay

## 2021-04-21 MED ORDER — DONEPEZIL HCL 5 MG PO TABS: ORAL_TABLET | ORAL | 11 refills | Status: DC

## 2021-04-21 NOTE — Telephone Encounter (Signed)
Patients husband Roger Shelter called and stated that they were supposed to get a prescription for aricept.  He stated he has not heard anything about it and was following up. ?

## 2021-04-21 NOTE — Telephone Encounter (Signed)
It was the wrong address, corrected   ?

## 2021-04-21 NOTE — Telephone Encounter (Signed)
Sent to another pharmacy

## 2021-05-07 IMAGING — CT CT MAXILLOFACIAL W/O CM
1 series · 16 of 30 positions shown, 20 images · non-contrast
Comparison: None.

CLINICAL DATA: Sinus pressure

EXAM:
CT MAXILLOFACIAL WITHOUT CONTRAST
TECHNIQUE: Multidetector CT images of the paranasal sinuses were obtained using
the standard protocol without intravenous contrast.

[Series 4: maxofacial soft · axial · 0.31mm/px · z∈[-179,-71]mm · 16 of 59 slices shown, 20 images]
[im 3/59  brain]
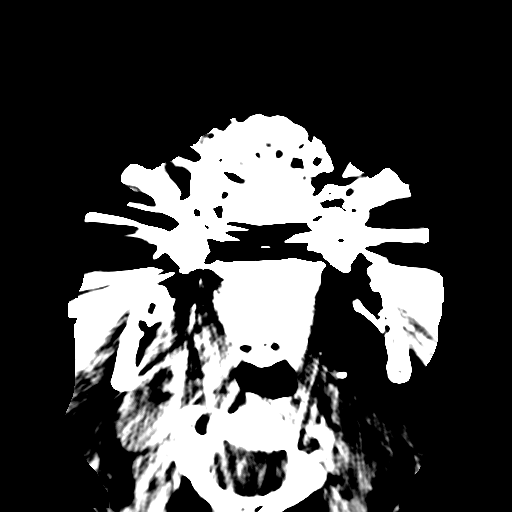
[im 3/59  bone]
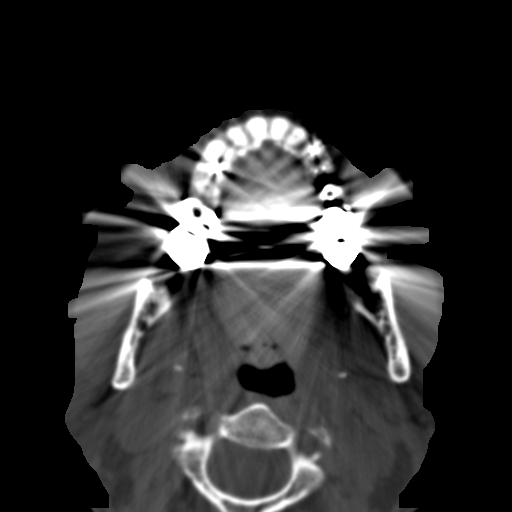
[im 7/59  bone]
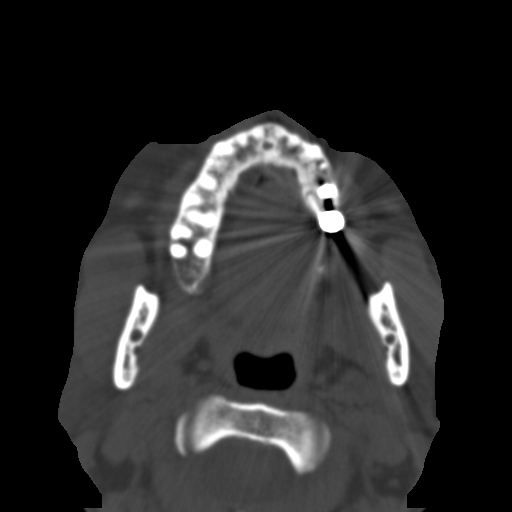
[im 11/59  bone]
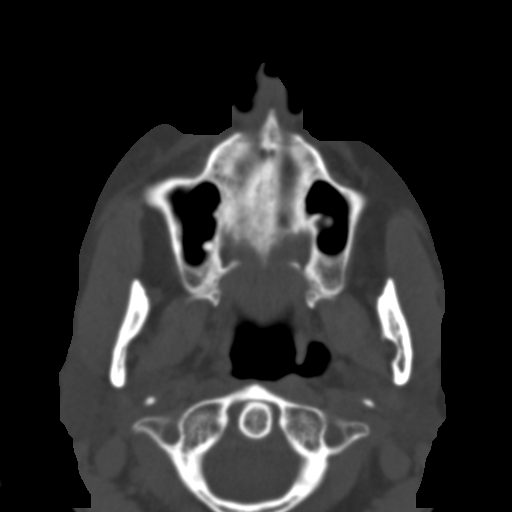
[im 15/59  bone]
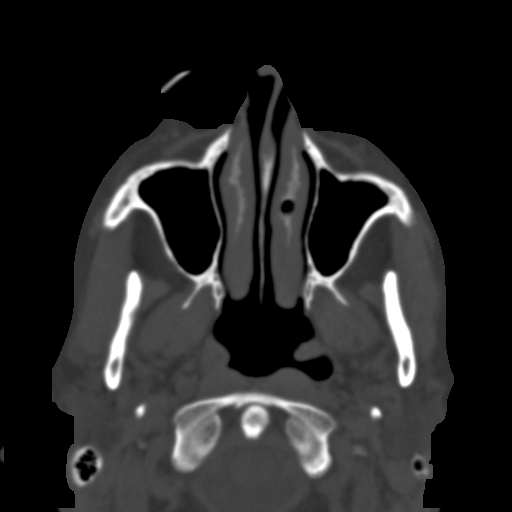
[im 17/59  brain]
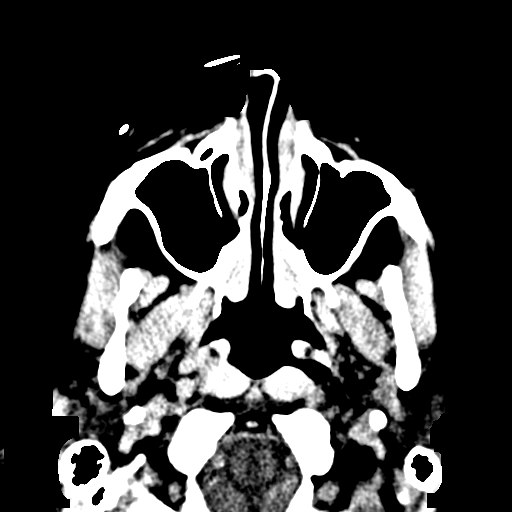
[im 17/59  bone]
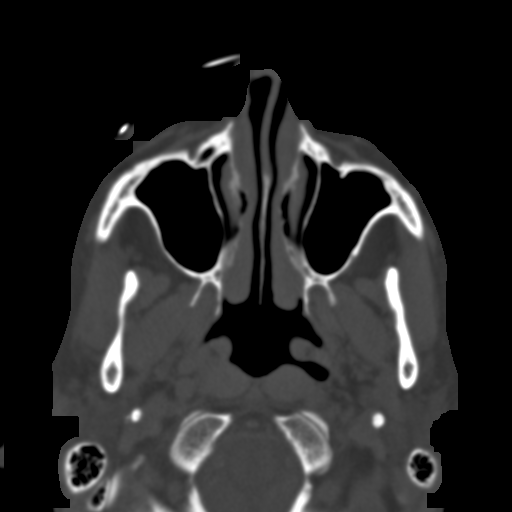
[im 21/59  bone]
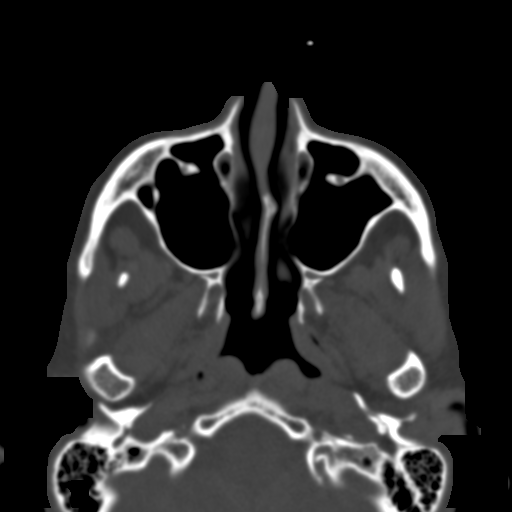
[im 25/59  bone]
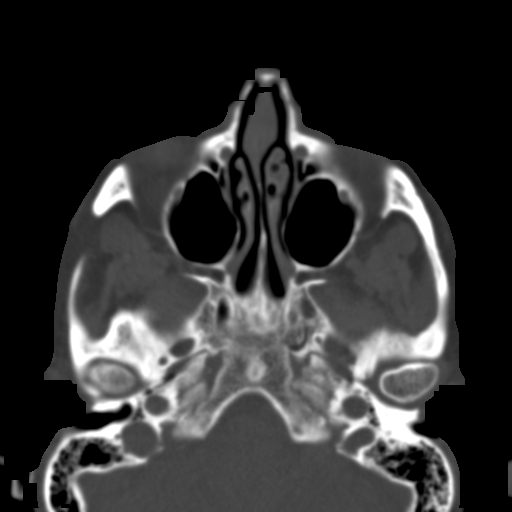
[im 29/59  bone]
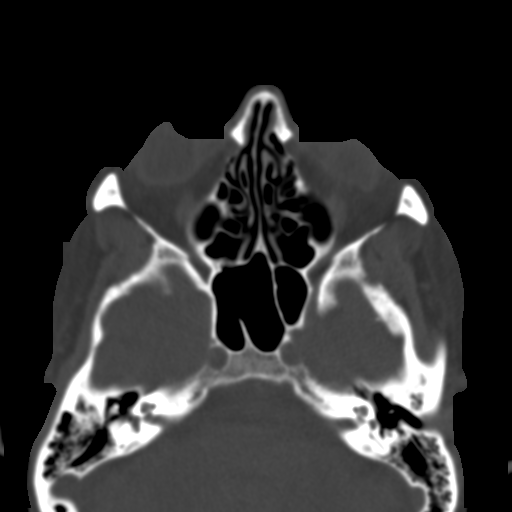
[im 31/59  brain]
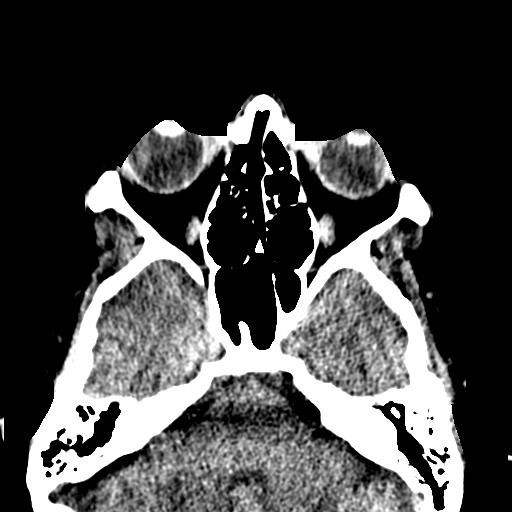
[im 31/59  bone]
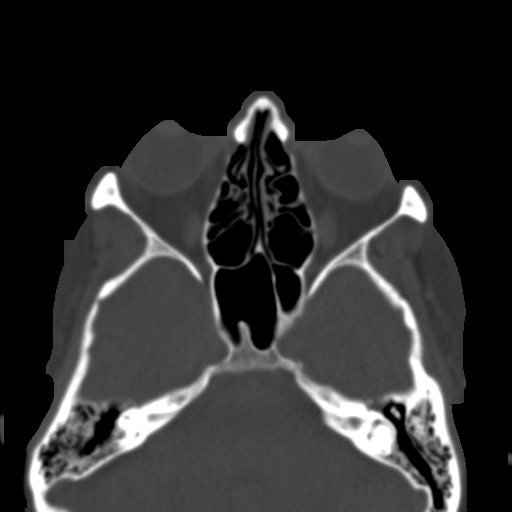
[im 35/59  bone]
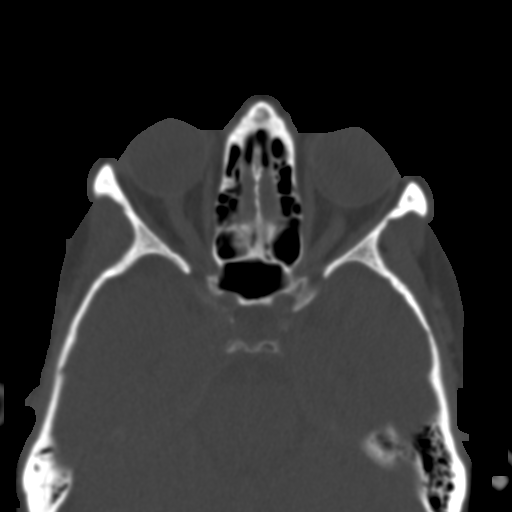
[im 39/59  bone]
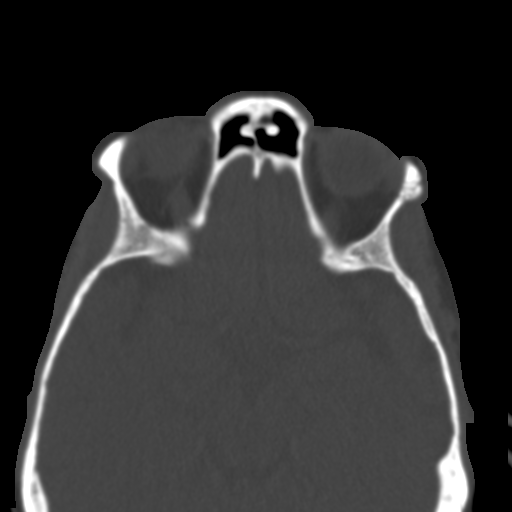
[im 43/59  bone]
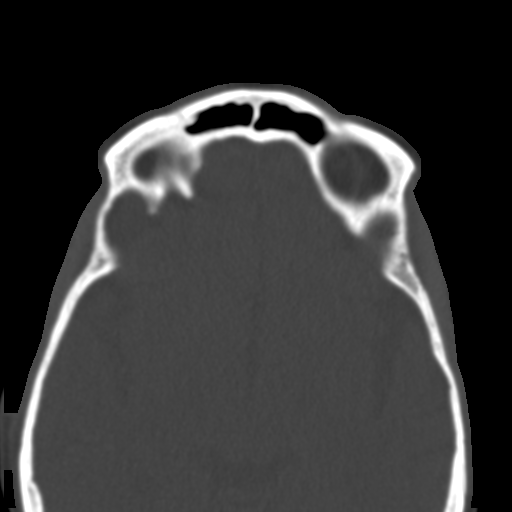
[im 45/59  brain]
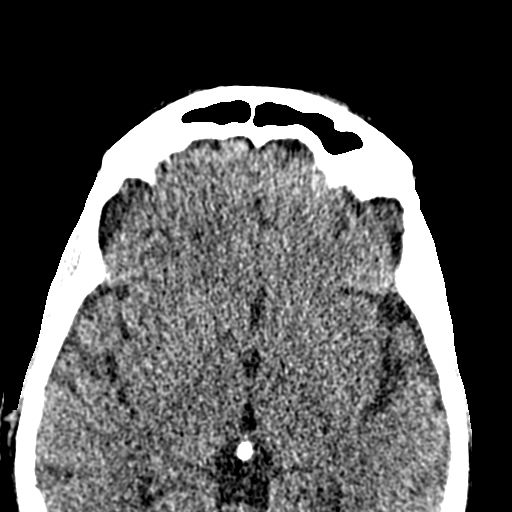
[im 45/59  bone]
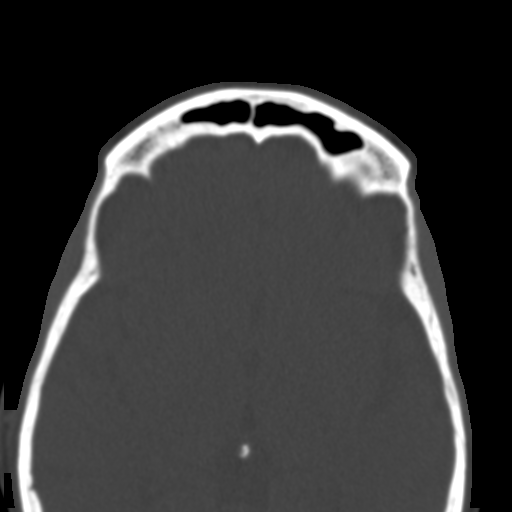
[im 49/59  bone]
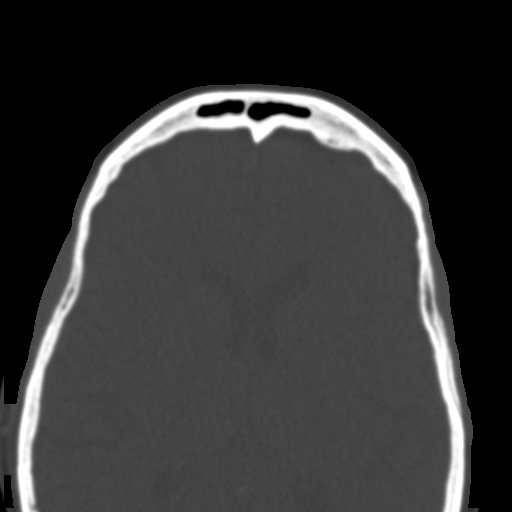
[im 53/59  bone]
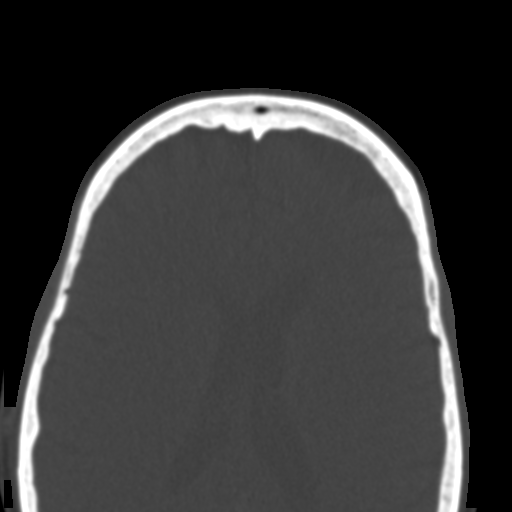
[im 57/59  bone]
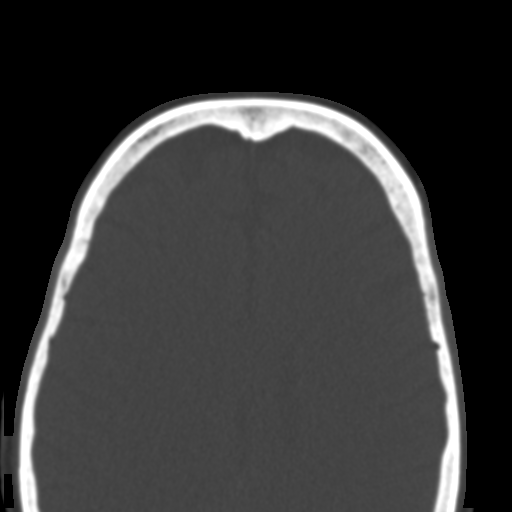

[16 of 30 positions shown; findings below may reference images not displayed]

FINDINGS: Paranasal sinuses:

Frontal: Normally aerated. Patent frontal sinus drainage pathways.

Ethmoid: Normally aerated.

Maxillary: Normally aerated.

Sphenoid: Normally aerated. Patent sphenoethmoidal recesses.

Mastoid: Clear bilaterally.

Right ostiomeatal unit: Patent.

Left ostiomeatal unit: Patent.

Nasal passages: Patent. Mild septal deviation bilaterally.

Anatomy: Mild concha bullosa left middle turbinates.

Other: Limited intracranial imaging negative

Negative orbit and surrounding soft tissues.
IMPRESSION: Normally aerated paranasal sinuses.  Patent sinus drainage pathways.

## 2021-05-19 DIAGNOSIS — M2042 Other hammer toe(s) (acquired), left foot: Secondary | ICD-10-CM | POA: Diagnosis not present

## 2021-05-19 DIAGNOSIS — I739 Peripheral vascular disease, unspecified: Secondary | ICD-10-CM | POA: Diagnosis not present

## 2021-05-19 DIAGNOSIS — M205X1 Other deformities of toe(s) (acquired), right foot: Secondary | ICD-10-CM | POA: Diagnosis not present

## 2021-05-19 DIAGNOSIS — M21612 Bunion of left foot: Secondary | ICD-10-CM | POA: Diagnosis not present

## 2021-05-19 DIAGNOSIS — M65872 Other synovitis and tenosynovitis, left ankle and foot: Secondary | ICD-10-CM | POA: Diagnosis not present

## 2021-06-01 ENCOUNTER — Other Ambulatory Visit: Payer: Self-pay | Admitting: Podiatry

## 2021-06-04 NOTE — Telephone Encounter (Signed)
Should not need refill

## 2021-06-15 DIAGNOSIS — M21612 Bunion of left foot: Secondary | ICD-10-CM | POA: Diagnosis not present

## 2021-06-15 DIAGNOSIS — M65872 Other synovitis and tenosynovitis, left ankle and foot: Secondary | ICD-10-CM | POA: Diagnosis not present

## 2021-06-15 DIAGNOSIS — I739 Peripheral vascular disease, unspecified: Secondary | ICD-10-CM | POA: Diagnosis not present

## 2021-06-15 DIAGNOSIS — M2042 Other hammer toe(s) (acquired), left foot: Secondary | ICD-10-CM | POA: Diagnosis not present

## 2021-06-15 DIAGNOSIS — M205X1 Other deformities of toe(s) (acquired), right foot: Secondary | ICD-10-CM | POA: Diagnosis not present

## 2021-09-03 ENCOUNTER — Ambulatory Visit: Payer: Self-pay

## 2021-09-03 NOTE — Patient Outreach (Signed)
  Care Coordination   09/03/2021 Name: Robyn Hall MRN: [id number] DOB: 02-14-1950   Care Coordination Outreach Attempts:  Contacted Ms. Fischbach today. Anticipates being available to complete care coordination outreach on September 16, 2021.   Follow Up Plan:   Appointment scheduled. Contact information provided. Patient agreed to call if needed prior to scheduled outreach.  Encounter Outcome:  Pt. Scheduled  Care Coordination Interventions Activated:  No   Care Coordination Interventions:  No, not indicated Will further address during scheduled outreach.    [facility] Care Management [phone]

## 2021-09-08 ENCOUNTER — Other Ambulatory Visit: Payer: Self-pay | Admitting: Physician Assistant

## 2021-09-08 ENCOUNTER — Telehealth: Payer: Self-pay | Admitting: Physician Assistant

## 2021-09-08 MED ORDER — DONEPEZIL HCL 5 MG PO TABS: 5.0000 mg | ORAL_TABLET | Freq: Every day | ORAL | 11 refills | Status: AC

## 2021-09-08 NOTE — Telephone Encounter (Signed)
No answer at 2:56 09/08/2021 will try to call back.

## 2021-09-08 NOTE — Telephone Encounter (Signed)
Pt's husband called in stating the new lequembi that has come out. He would like to have a conversation about the possibility of his wife taking it and what Huntley Dec thinks about it.   He also stated the donepezil won't quite make it to the pt's next appointment and would like a refill so she doesn't run out. She has about 7 pills left. [facility] [address]

## 2021-09-09 NOTE — Telephone Encounter (Signed)
Husband is gone, will call me back later today. 10:17am 09/09/2021

## 2021-09-10 NOTE — Telephone Encounter (Signed)
No answer again, 3 attempts.

## 2021-09-10 NOTE — Telephone Encounter (Signed)
I tried to call again unable to leave a message, at 9:10am 09/10/2021. Will try one more time later.

## 2021-10-08 DIAGNOSIS — N183 Chronic kidney disease, stage 3 unspecified: Secondary | ICD-10-CM | POA: Diagnosis not present

## 2021-10-08 DIAGNOSIS — K219 Gastro-esophageal reflux disease without esophagitis: Secondary | ICD-10-CM | POA: Diagnosis not present

## 2021-10-08 DIAGNOSIS — Z0000 Encounter for general adult medical examination without abnormal findings: Secondary | ICD-10-CM | POA: Diagnosis not present

## 2021-10-08 DIAGNOSIS — J309 Allergic rhinitis, unspecified: Secondary | ICD-10-CM | POA: Diagnosis not present

## 2021-10-08 DIAGNOSIS — I129 Hypertensive chronic kidney disease with stage 1 through stage 4 chronic kidney disease, or unspecified chronic kidney disease: Secondary | ICD-10-CM | POA: Diagnosis not present

## 2021-10-08 DIAGNOSIS — G309 Alzheimer's disease, unspecified: Secondary | ICD-10-CM | POA: Diagnosis not present

## 2021-10-08 DIAGNOSIS — E7800 Pure hypercholesterolemia, unspecified: Secondary | ICD-10-CM | POA: Diagnosis not present

## 2021-10-08 DIAGNOSIS — Z23 Encounter for immunization: Secondary | ICD-10-CM | POA: Diagnosis not present

## 2021-10-08 DIAGNOSIS — F419 Anxiety disorder, unspecified: Secondary | ICD-10-CM | POA: Diagnosis not present

## 2021-10-23 ENCOUNTER — Encounter: Payer: Self-pay | Admitting: Physician Assistant

## 2021-10-23 ENCOUNTER — Ambulatory Visit: Payer: Medicare PPO | Admitting: Physician Assistant

## 2021-10-23 VITALS — Resp 20 | Ht 62.0 in

## 2021-10-23 DIAGNOSIS — F0670 Mild neurocognitive disorder due to known physiological condition without behavioral disturbance: Secondary | ICD-10-CM | POA: Diagnosis not present

## 2021-10-23 DIAGNOSIS — G309 Alzheimer's disease, unspecified: Secondary | ICD-10-CM | POA: Diagnosis not present

## 2021-10-23 NOTE — Patient Instructions (Addendum)
It was a pleasure to see you today at our office.   Recommendations:  Referral to [facility] for second opinion  Follow up April 19 11:30  Neurocognitive testing next year  Continue donepezil 5 mg daily. Side effects were discussed  Recommend good control of cardiovascular risk factors.  Recommend baby aspirin daily      RECOMMENDATIONS FOR ALL PATIENTS WITH MEMORY PROBLEMS: 1. Continue to exercise (Recommend 30 minutes of walking everyday, or 3 hours every week) 2. Increase social interactions - continue going to [place] and enjoy social gatherings with friends and family 3. Eat healthy, avoid fried foods and eat more fruits and vegetables 4. Maintain adequate blood pressure, blood sugar, and blood cholesterol level. Reducing the risk of stroke and cardiovascular disease also helps promoting better memory. 5. Avoid stressful situations. Live a simple life and avoid aggravations. Organize your time and prepare for the next day in anticipation. 6. Sleep well, avoid any interruptions of sleep and avoid any distractions in the bedroom that may interfere with adequate sleep quality 7. Avoid sugar, avoid sweets as there is a strong link between excessive sugar intake, diabetes, and cognitive impairment We discussed the Mediterranean diet, which has been shown to help patients reduce the risk of progressive memory disorders and reduces cardiovascular risk. This includes eating fish, eat fruits and green leafy vegetables, nuts like almonds and hazelnuts, walnuts, and also use olive oil. Avoid fast foods and fried foods as much as possible. Avoid sweets and sugar as sugar use has been linked to worsening of memory function.  There is always a concern of gradual progression of memory problems. If this is the case, then we may need to adjust level of care according to patient needs. Support, both to the patient and caregiver, should then be put into place.       FALL PRECAUTIONS: Be cautious when walking.  Scan the area for obstacles that may increase the risk of trips and falls. When getting up in the mornings, sit up at the edge of the bed for a few minutes before getting out of bed. Consider elevating the bed at the head end to avoid drop of blood pressure when getting up. Walk always in a well-lit room (use night lights in the walls). Avoid area rugs or power cords from appliances in the middle of the walkways. Use a walker or a cane if necessary and consider physical therapy for balance exercise. Get your eyesight checked regularly.  FINANCIAL OVERSIGHT: Supervision, especially oversight when making financial decisions or transactions is also recommended.  HOME SAFETY: Consider the safety of the kitchen when operating appliances like stoves, microwave oven, and blender. Consider having supervision and share cooking responsibilities until no longer able to participate in those. Accidents with firearms and other hazards in the house should be identified and addressed as well.   ABILITY TO BE LEFT ALONE: If patient is unable to contact 911 operator, consider using LifeLine, or when the need is there, arrange for someone to stay with patients. Smoking is a fire hazard, consider supervision or cessation. Risk of wandering should be assessed by caregiver and if detected at any point, supervision and safe proof recommendations should be instituted.  MEDICATION SUPERVISION: Inability to self-administer medication needs to be constantly addressed. Implement a mechanism to ensure safe administration of the medications.   DRIVING: Regarding driving, in patients with progressive memory problems, driving will be impaired. We advise to have someone else do the driving if trouble finding directions or  if minor accidents are reported. Independent driving assessment is available to determine safety of driving.   If you are interested in the driving assessment, you can contact the following:  [employer]  in [place]  [place] [phone] or [phone]    Elsie refers to food and lifestyle choices that are based on the traditions of countries located on the [employer]. This way of eating has been shown to help prevent certain conditions and improve outcomes for people who have chronic diseases, like kidney disease and heart disease. What are tips for following this plan? Lifestyle  Cook and eat meals together with your family, when possible. Drink enough fluid to keep your urine clear or pale yellow. Be physically active every day. This includes: Aerobic exercise like running or swimming. Leisure activities like gardening, walking, or housework. Get 7-8 hours of sleep each night. If recommended by your health care provider, drink red wine in moderation. This means 1 glass a day for nonpregnant women and 2 glasses a day for men. A glass of wine equals 5 oz (150 mL). Reading food labels  Check the serving size of packaged foods. For foods such as rice and pasta, the serving size refers to the amount of cooked product, not dry. Check the total fat in packaged foods. Avoid foods that have saturated fat or trans fats. Check the ingredients list for added sugars, such as corn syrup. Shopping  At the grocery store, buy most of your food from the areas near the walls of the store. This includes: Fresh fruits and vegetables (produce). Grains, beans, nuts, and seeds. Some of these may be available in unpackaged forms or large amounts (in bulk). Fresh seafood. Poultry and eggs. Low-fat dairy products. Buy whole ingredients instead of prepackaged foods. Buy fresh fruits and vegetables in-season from local farmers markets. Buy frozen fruits and vegetables in resealable bags. If you do not have access to quality fresh seafood, buy precooked frozen  shrimp or canned fish, such as tuna, salmon, or sardines. Buy small amounts of raw or cooked vegetables, salads, or olives from the deli or salad bar at your store. Stock your pantry so you always have certain foods on hand, such as olive oil, canned tuna, canned tomatoes, rice, pasta, and beans. Cooking  Cook foods with extra-virgin olive oil instead of using butter or other vegetable oils. Have meat as a side dish, and have vegetables or grains as your main dish. This means having meat in small portions or adding small amounts of meat to foods like pasta or stew. Use beans or vegetables instead of meat in common dishes like chili or lasagna. Experiment with different cooking methods. Try roasting or broiling vegetables instead of steaming or sauteing them. Add frozen vegetables to soups, stews, pasta, or rice. Add nuts or seeds for added healthy fat at each meal. You can add these to yogurt, salads, or vegetable dishes. Marinate fish or vegetables using olive oil, lemon juice, garlic, and fresh herbs. Meal planning  Plan to eat 1 vegetarian meal one day each week. Try to work up to 2 vegetarian meals, if possible. Eat seafood 2 or more times a week. Have healthy snacks readily available, such as: Vegetable sticks with hummus. Greek yogurt. Fruit and nut trail mix. Eat balanced meals throughout the week. This includes: Fruit: 2-3 servings a day Vegetables: 4-5 servings a day Low-fat dairy: 2 servings  a day Fish, poultry, or lean meat: 1 serving a day Beans and legumes: 2 or more servings a week Nuts and seeds: 1-2 servings a day Whole grains: 6-8 servings a day Extra-virgin olive oil: 3-4 servings a day Limit red meat and sweets to only a few servings a month What are my food choices? Mediterranean diet Recommended Grains: Whole-grain pasta. Brown rice. Bulgar wheat. Polenta. Couscous. Whole-wheat bread. Orpah Cobb. Vegetables: Artichokes. Beets. Broccoli. Cabbage. Carrots.  Eggplant. Green beans. Chard. Kale. Spinach. Onions. Leeks. Peas. Squash. Tomatoes. Peppers. Radishes. Fruits: Apples. Apricots. Avocado. Berries. Bananas. Cherries. Dates. Figs. Grapes. Lemons. Melon. Oranges. Peaches. Plums. Pomegranate. Meats and other protein foods: Beans. Almonds. Sunflower seeds. Pine nuts. Peanuts. Cod. Salmon. Scallops. Shrimp. Tuna. Tilapia. Clams. Oysters. Eggs. Dairy: Low-fat milk. Cheese. Greek yogurt. Beverages: Water. Red wine. Herbal tea. Fats and oils: Extra virgin olive oil. Avocado oil. Grape seed oil. Sweets and desserts: [place] yogurt with honey. Baked apples. Poached pears. Trail mix. Seasoning and other foods: Basil. Cilantro. Coriander. Cumin. Mint. Parsley. Sage. Rosemary. Tarragon. Garlic. Oregano. Thyme. Pepper. Balsalmic vinegar. Tahini. Hummus. Tomato sauce. Olives. Mushrooms. Limit these Grains: Prepackaged pasta or rice dishes. Prepackaged cereal with added sugar. Vegetables: Deep fried potatoes (french fries). Fruits: Fruit canned in syrup. Meats and other protein foods: Beef. Pork. Lamb. Poultry with skin. Hot dogs. Tomasa Blase. Dairy: Ice cream. Sour cream. Whole milk. Beverages: Juice. Sugar-sweetened soft drinks. Beer. Liquor and spirits. Fats and oils: Butter. Canola oil. Vegetable oil. Beef fat (tallow). Lard. Sweets and desserts: Cookies. Cakes. Pies. Candy. Seasoning and other foods: Mayonnaise. Premade sauces and marinades. The items listed may not be a complete list. Talk with your dietitian about what dietary choices are right for you. Summary The Mediterranean diet includes both food and lifestyle choices. Eat a variety of fresh fruits and vegetables, beans, nuts, seeds, and whole grains. Limit the amount of red meat and sweets that you eat. Talk with your health care provider about whether it is safe for you to drink red wine in moderation. This means 1 glass a day for nonpregnant women and 2 glasses a day for men. A glass of wine equals 5  oz (150 mL). This information is not intended to replace advice given to you by your health care provider. Make sure you discuss any questions you have with your health care provider. Document Released: 09/11/2015 Document Revised: 10/14/2015 Document Reviewed: 09/11/2015 Elsevier Interactive Patient Education  2017 [employer].

## 2021-10-23 NOTE — Progress Notes (Signed)
Assessment/Plan:   Mild Cognitive Impairment due to Alzheimer's disease  Robyn Hall is a very pleasant 71 y.o. RH female presenting today in follow-up for evaluation of memory loss. Patient is on donepezil 5 mg daily, tolerating well.  Sometimes she misses a dose, and her husband has been more proactive of monitoring it.  Overall, her cognitive status is stable.  Her husband is interested in seeking a second opinion, regarding her diagnosis of mild neurocognitive disorder due to Alzheimer's disease, which it is reasonable given the fact of early stages of this disease.  She declines having an MRI of the brain at this time.  They are interested in pursuing evaluation at [facility].  Follow-up in 6 to 7 months Recommend repeat neurocognitive testing for clarity of the diagnosis and disease progression Continue donepezil 5 mg daily Referral to [facility], neurology department for second opinion regarding mild cognitive impairment due to Alzheimer's disease Recommend good control of cardiovascular risk factors.  Subjective:   This patient is accompanied in the office by   who supplements the history. Previous records as well as any outside records available were reviewed prior to todays visit.  Patient is on donepezil 5 mg daily.    Any changes in memory since last visit?  Patient had COVID 3 months ago, she had some "COVID head "at the time, but she now has recovered, and she reports that there are no significant changes in her memory.  Short-term memory is worse than long-term memory.  She continues to Writer. repeats oneself?  Sometimes  Disoriented when walking into a room?  Patient denies   Leaving objects in unusual places?  Patient denies   Ambulates  with difficulty?   Patient denies   Recent falls?  Patient denies   Any head injuries?  Patient denies   History of seizures?   Patient denies   Wandering behavior?  Patient denies   Patient drives?   No issues with  driving. Any mood changes since last visit?  Patient denies. Any worsening depression?:  Patient denies   Hallucinations?  Patient denies   Paranoia?  Patient denies   Patient reports that sleeps well without vivid dreams, REM behavior or sleepwalking   History of sleep apnea?  Patient denies   Any hygiene concerns?  Patient denies   Independent of bathing and dressing?  Endorsed  Does the patient needs help with medications?  Denies    Any changes in appetite?  Patient denies   Patient have trouble swallowing? Patient denies   Does the patient cook?  Patient denies   Any kitchen accidents such as leaving the stove on? Patient denies   Any headaches?  Patient denies   Double vision? Patient denies   Any focal numbness or tingling?  Patient denies   Chronic back pain Patient denies   Unilateral weakness?  Patient denies   Any tremors?  Patient denies   Any history of anosmia?  Patient denies   Any incontinence of urine?  Patient denies   Any bowel dysfunction?   Patient denies      Patient lives with husband   Initial visit 04/19/21 The patient is seen in neurologic consultation at the request of Hazle Coca, PhD for the evaluation of memory.  The patient is accompanied by  who supplements the history. This is a 71 y.o. year old RH  female who has had memory issues for about one year.  Per her husband, the patient went to  a yearly beach vacation with her family, and during that trip, they expressed concerns about her, as she was repeating herself, being more forgetful.  Her husband did acknowledge observing some very mild forgetfulness up to 6 months prior. Initially she minimized the symptoms.  Eventually she was seen by her PCP in September 2022, when she was referred to comprehensive neuropsychological evaluation to characterize her cognitive abilities and to diagnose her condition, and treatment plan. Even then, she denied her short-term memory deficits, stating that "they were not bad  enough to bother me ".  She denies any depression, or irritability.  She denied initially any history of anxiety, but eventually acknowledged taking an antianxiety medication as needed, especially during testing procedures.  She denies any issues with long-term memory, attention or concentration, or processing speed.  She does report some lifelong disorganization, but this is unchanged from before.  There are no personality changes.  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 She denies any difficulties understanding, and she enjoys doing word finding and crossword puzzles.  She also enjoys singing in the car with her husband and denies forgetting any of the lyrics of songs.  She continues to drive and denies getting lost. Denies history of traumatic brain injury or concussion, stroke, seizures, exposure to toxins, chronic pain, frequent headaches or migraines, dizziness or vertigo. She sleeps about 7 to 8 hours, denies difficulties falling asleep, staying asleep, and she feels rested upon waking up.  She denies history of snoring or history of sleep apnea.  She denies vivid dreams, REM behavior, hallucinations or paranoia.  She denies any anosmia.  Denies any urine incontinence or bowel complaints.  Denies alcohol or tobacco.  Family history negative for dementia.     Neuropsych evaluation 03/30/2021 Dr. Melvyn Novas briefly, results suggested severe impairment across all aspects of learning and memory. Additional impairment was exhibited across working memory, executive functioning, semantic fluency, and confrontation naming. Performance variability was exhibited across visuospatial abilities. I unfortunately have particular concerns surrounding Alzheimer's disease. Across memory testing, Robyn Hall did not benefit from repeated learning trials and was essentially amnestic across all three verbal memory measures. Despite achieving a 75%  retention score on a visual memory task, this task is a forced choice format rather than spontaneous provision, suggesting that Robyn Hall likely benefited from happenstance guessing. Performances across yes/no recognition trials were quite poor. Altogether, this suggests the presence of rapid forgetting and a severe memory storage impairment, both of which all the hallmark memory characteristics of Alzheimer's disease. Further impairment in semantic fluency and confrontation naming are also expected in typical disease progression.   No Known Allergies Past Medical History:  Diagnosis Date   Allergic rhinitis    Chronic kidney disease, stage 3    Eustachian tube dysfunction, bilateral 11/02/2018   Gastro-esophageal reflux disease without esophagitis    Generalized anxiety disorder    High cholesterol    Hyperlipemia    Hypertension    Mild neurocognitive disorder due to Alzheimer's disease 03/30/2021     No past surgical history on file.   PREVIOUS MEDICATIONS:   CURRENT MEDICATIONS:  Outpatient Encounter Medications as of 10/23/2021  Medication Sig   atenolol (TENORMIN) 50 MG tablet Take 50 mg by mouth every morning.   atorvastatin (LIPITOR) 20 MG tablet Take 1 tablet by mouth daily.   citalopram (CELEXA) 20 MG tablet Take 20 mg by mouth every  morning.    donepezil (ARICEPT) 5 MG tablet Take 1 tablet (5 mg total) by mouth at bedtime.   lisinopril (ZESTRIL) 10 MG tablet TAKE 1 TABLET BY MOUTH EVERY DAY FOR BLOOD PRESSURE   atorvastatin (LIPITOR) 20 MG tablet Take 20 mg by mouth daily.   famotidine (PEPCID) 20 MG tablet 1 tablet at bedtime as needed   lisinopril (PRINIVIL,ZESTRIL) 10 MG tablet Take 10 mg by mouth every morning.    pantoprazole (PROTONIX) 40 MG tablet Take 40 mg by mouth daily as needed (for heartburn).    pantoprazole (PROTONIX) 40 MG tablet 1 TABLET ONCE A DAY IF NEEDED ORALLY   [DISCONTINUED] donepezil (ARICEPT) 5 MG tablet Take half tablet (2.5 mg) daily for 2  weeks, then increase to the full tablet at 5 mg daily (Patient taking differently: Take 5 mg by mouth daily.)   No facility-administered encounter medications on file as of 10/23/2021.     Objective:     PHYSICAL EXAMINATION:    VITALS:  There were no vitals filed for this visit.  GEN:  The patient appears stated age and is in NAD. HEENT:  Normocephalic, atraumatic.   Neurological examination:  General: NAD, well-groomed, appears stated age. Orientation: The patient is alert. Oriented to person, place and date Cranial nerves: There is good facial symmetry.The speech is fluent and clear. No aphasia or dysarthria. Fund of knowledge is appropriate. Recent memory impaired and remote memory is normal.  Attention and concentration are normal.  Able to name objects and repeat phrases.  Hearing is intact to conversational tone.    Sensation: Sensation is intact to light touch throughout Motor: Strength is at least antigravity x4. Tremors: none  DTR's 2/4 in UE/LE       No data to display              No data to display             Movement examination: Tone: There is normal tone in the UE/LE Abnormal movements:  no tremor.  No myoclonus.  No asterixis.   Coordination:  There is no decremation with RAM's. Normal finger to nose  Gait and Station: The patient has no difficulty arising out of a deep-seated chair without the use of the hands. The patient's stride length is good.  Gait is cautious and narrow.   Thank you for allowing [place] the opportunity to participate in the care of this nice patient. Please do not hesitate to contact us for any questions or concerns.   Total time spent on today's visit was 42 minutes dedicated to this patient today, preparing to see patient, examining the patient, ordering tests and/or medications and counseling the patient, documenting clinical information in the EHR or other health record, independently interpreting results and communicating  results to the patient/family, discussing treatment and goals, answering patient's questions and coordinating care.  Cc:  Alroy Dust, L.Marlou Sa, MD  Sharene Butters 10/23/2021 2:50 PM

## 2021-11-09 DIAGNOSIS — R739 Hyperglycemia, unspecified: Secondary | ICD-10-CM | POA: Diagnosis not present

## 2022-04-28 DIAGNOSIS — G301 Alzheimer's disease with late onset: Secondary | ICD-10-CM | POA: Diagnosis not present

## 2022-04-28 DIAGNOSIS — F02A0 Dementia in other diseases classified elsewhere, mild, without behavioral disturbance, psychotic disturbance, mood disturbance, and anxiety: Secondary | ICD-10-CM | POA: Diagnosis not present

## 2022-04-28 DIAGNOSIS — I10 Essential (primary) hypertension: Secondary | ICD-10-CM | POA: Diagnosis not present

## 2022-05-21 ENCOUNTER — Ambulatory Visit: Payer: Medicare PPO | Admitting: Physician Assistant

## 2022-06-08 ENCOUNTER — Encounter: Payer: Self-pay | Admitting: Podiatry

## 2022-06-08 ENCOUNTER — Ambulatory Visit: Payer: Medicare PPO | Admitting: Podiatry

## 2022-06-08 DIAGNOSIS — S99922A Unspecified injury of left foot, initial encounter: Secondary | ICD-10-CM

## 2022-06-08 DIAGNOSIS — M7752 Other enthesopathy of left foot: Secondary | ICD-10-CM

## 2022-06-09 NOTE — Progress Notes (Signed)
She presents with her husband today for follow-up of her capsulitis of her left foot.  States that is still in the same spot as she refers to the second metatarsal phalangeal joint of the left foot.  States that it hurts all the time is never really improved.  States that she has had injections in it.  Objective: Vital signs are stable alert and oriented x 3 left foot demonstrates strong palpable pulse.  There is no erythema cellulitis drainage or odor though there is edema overlying the second metatarsophalangeal joint with pain on end range of motion of that toe at the metatarsal phalangeal joint.  Hallux valgus deformity is also noted possibly contributing to this as well as a plantarflexed elongated second metatarsal.  Assessment: Cannot rule out a tear of the plantar plate or the capsule of the joint.  Plan: At this point conservative therapies have been exhausted so I feel that an MRI is necessary we are requesting MRI of the forefoot for a possible capsular tear of the second metatarsal phalangeal joint or any other differential diagnosis for surgical treatment.

## 2022-06-23 DIAGNOSIS — H5213 Myopia, bilateral: Secondary | ICD-10-CM | POA: Diagnosis not present

## 2022-06-25 ENCOUNTER — Ambulatory Visit
Admission: RE | Admit: 2022-06-25 | Discharge: 2022-06-25 | Disposition: A | Discharge status: Home / Self Care | Payer: Medicare PPO | Source: Ambulatory Visit | Attending: Podiatry | Admitting: Podiatry

## 2022-06-25 DIAGNOSIS — M19072 Primary osteoarthritis, left ankle and foot: Secondary | ICD-10-CM | POA: Diagnosis not present

## 2022-06-25 DIAGNOSIS — S99922A Unspecified injury of left foot, initial encounter: Secondary | ICD-10-CM

## 2022-06-25 DIAGNOSIS — M2012 Hallux valgus (acquired), left foot: Secondary | ICD-10-CM | POA: Diagnosis not present

## 2022-07-07 DIAGNOSIS — H26493 Other secondary cataract, bilateral: Secondary | ICD-10-CM | POA: Diagnosis not present

## 2022-07-07 DIAGNOSIS — H26492 Other secondary cataract, left eye: Secondary | ICD-10-CM | POA: Diagnosis not present

## 2022-07-07 DIAGNOSIS — H26491 Other secondary cataract, right eye: Secondary | ICD-10-CM | POA: Diagnosis not present

## 2022-07-13 ENCOUNTER — Ambulatory Visit: Payer: Medicare PPO | Admitting: Podiatry

## 2022-07-13 ENCOUNTER — Encounter: Payer: Self-pay | Admitting: Podiatry

## 2022-07-13 DIAGNOSIS — S99922D Unspecified injury of left foot, subsequent encounter: Secondary | ICD-10-CM | POA: Diagnosis not present

## 2022-07-13 DIAGNOSIS — M205X2 Other deformities of toe(s) (acquired), left foot: Secondary | ICD-10-CM | POA: Diagnosis not present

## 2022-07-13 NOTE — Progress Notes (Signed)
She presented today for surgical consult after her MRI to her left foot.  She states that the foot is been hurting her just as badly and all conservative therapies have not been able to render her asymptomatic.  Speaking with her and her husband today this is been going on for many months to years and is been exquisitely painful for her.  Objective: Vital signs are stable alert and oriented x 3 no change in physical exam.  MRI discussion today goes over the read which is consistent with severe hallux valgus deformity osteoarthritic changes of both the first and second metatarsophalangeal joints with tearing of the second plantar plate.  I have reviewed her past medical history medications and allergies pulses are palpable.  Neurologic sensorium is intact.  She has pain on palpation of the second metatarsal phalangeal joint but no limitation on range of motion of the first metatarsophalangeal joint.  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  Plan: Discussed etiology pathology conservative versus surgical therapies at this point we consented her for an [facility] bunion repair and possible Keller arthroplasty depending on the intraoperative decision was made.  She understands this and is amenable to it also discussed a second metatarsal osteotomy Weil type with screw fixation.  She will also have to have a K wire placed to hold the second toe in position once the plantar plate is spared.  She understands this is amenable to it understands that the pin will be in for at least 6 weeks.  We did discuss the possible postop complications which may include but not limited to postop pain bleeding swelling infection recurrence need for further surgery overcorrection under correction loss of digit loss limb loss of life.  Signed all 3 pages a consent form and I will follow-up with her after July.

## 2022-08-25 ENCOUNTER — Other Ambulatory Visit: Payer: Self-pay | Admitting: Podiatry

## 2022-08-25 ENCOUNTER — Telehealth: Payer: Self-pay | Admitting: Podiatry

## 2022-08-25 MED ORDER — CEPHALEXIN 500 MG PO CAPS: 500.0000 mg | ORAL_CAPSULE | Freq: Three times a day (TID) | ORAL | 0 refills | Status: DC

## 2022-08-25 MED ORDER — OXYCODONE-ACETAMINOPHEN 10-325 MG PO TABS: 1.0000 | ORAL_TABLET | Freq: Three times a day (TID) | ORAL | 0 refills | Status: AC | PRN

## 2022-08-25 MED ORDER — ONDANSETRON HCL 4 MG PO TABS: 4.0000 mg | ORAL_TABLET | Freq: Three times a day (TID) | ORAL | 0 refills | Status: DC | PRN

## 2022-08-25 NOTE — Telephone Encounter (Signed)
DOS 08/27/2022  [place] BUNIONECTOMY LT  - 28296 KELLER BUNION IMPLANT LT - 28291 METATARSAL OSTEOTOMY 2ND LT - 28308  [employer] MEDICARE EFFECTIVE DATE - 02/02/2019  OOP $4000 W/ $244.73 REMAINING  PER [employer] CPT CODES 16109, 60454, AND 28296 APPROVED  FOR DOS 08/27/2022.   AUTH # [id number]

## 2022-08-27 DIAGNOSIS — G8918 Other acute postprocedural pain: Secondary | ICD-10-CM | POA: Diagnosis not present

## 2022-08-27 DIAGNOSIS — M205X2 Other deformities of toe(s) (acquired), left foot: Secondary | ICD-10-CM | POA: Diagnosis not present

## 2022-08-27 DIAGNOSIS — M21542 Acquired clubfoot, left foot: Secondary | ICD-10-CM | POA: Diagnosis not present

## 2022-08-27 DIAGNOSIS — M2012 Hallux valgus (acquired), left foot: Secondary | ICD-10-CM | POA: Diagnosis not present

## 2022-09-02 ENCOUNTER — Encounter: Payer: Self-pay | Admitting: Podiatry

## 2022-09-02 ENCOUNTER — Ambulatory Visit (INDEPENDENT_AMBULATORY_CARE_PROVIDER_SITE_OTHER): Payer: Medicare PPO | Admitting: Podiatry

## 2022-09-02 ENCOUNTER — Ambulatory Visit (INDEPENDENT_AMBULATORY_CARE_PROVIDER_SITE_OTHER): Payer: Medicare PPO

## 2022-09-02 DIAGNOSIS — M205X2 Other deformities of toe(s) (acquired), left foot: Secondary | ICD-10-CM

## 2022-09-02 DIAGNOSIS — Z9889 Other specified postprocedural states: Secondary | ICD-10-CM

## 2022-09-04 NOTE — Progress Notes (Signed)
She presents today she is a status post [place] bunion repair and second metatarsal osteotomy.  She states that is doing okay she denies fever chills nausea vomit muscle aches and pains date of surgery was 08/27/2022.  Objective: Presents today with her cam boot and dry sterile dressing intact once removed demonstrates minimal edema no erythema cellulitis drainage or odor her toe is rectus she has great range of motion.  Suture sites appear to be healing very nicely no signs of infection.  Assessment: Well-healing surgical foot.  Plan: I will follow-up with her in 1 to 2 weeks for suture removal and hopefully be able to remove her from her big boot.

## 2022-09-09 ENCOUNTER — Other Ambulatory Visit: Payer: Self-pay | Admitting: Physician Assistant

## 2022-09-09 DIAGNOSIS — J3489 Other specified disorders of nose and nasal sinuses: Secondary | ICD-10-CM | POA: Diagnosis not present

## 2022-09-14 ENCOUNTER — Ambulatory Visit (INDEPENDENT_AMBULATORY_CARE_PROVIDER_SITE_OTHER): Payer: Medicare PPO | Admitting: Podiatry

## 2022-09-14 ENCOUNTER — Encounter: Payer: Self-pay | Admitting: Podiatry

## 2022-09-14 DIAGNOSIS — Z9889 Other specified postprocedural states: Secondary | ICD-10-CM

## 2022-09-14 DIAGNOSIS — M205X2 Other deformities of toe(s) (acquired), left foot: Secondary | ICD-10-CM

## 2022-09-14 NOTE — Progress Notes (Signed)
She presents with her husband today for follow-up of her [facility] bunion repair and second metatarsal osteotomy date of surgery 08/27/2022.  She stated that is starting to feel much better than it was.  Denies fever chills nausea vomit muscle aches pains calf pain back pain chest pain shortness of breath.  Objective: Vital signs are stable oriented x 3 dressed her dressing intact once removed revealed moderate edema to the left foot and sutures are intact which were removed today removed leaving the margins well coapted.  Well-healing surgical foot  I am going to asked that she wear a Darco shoe and a compression anklet still try to keep the foot is elevated as possible reminding her that there is broken bones in that foot that are only held together by screws.  She understands this is amenable to it.  X-rays will be taken next visit

## 2022-09-23 DIAGNOSIS — G301 Alzheimer's disease with late onset: Secondary | ICD-10-CM | POA: Diagnosis not present

## 2022-09-23 DIAGNOSIS — F02A0 Dementia in other diseases classified elsewhere, mild, without behavioral disturbance, psychotic disturbance, mood disturbance, and anxiety: Secondary | ICD-10-CM | POA: Diagnosis not present

## 2022-10-07 ENCOUNTER — Ambulatory Visit (INDEPENDENT_AMBULATORY_CARE_PROVIDER_SITE_OTHER): Payer: Medicare PPO | Admitting: Podiatry

## 2022-10-07 ENCOUNTER — Ambulatory Visit (INDEPENDENT_AMBULATORY_CARE_PROVIDER_SITE_OTHER): Payer: Medicare PPO

## 2022-10-07 ENCOUNTER — Encounter: Payer: Self-pay | Admitting: Podiatry

## 2022-10-07 DIAGNOSIS — M205X2 Other deformities of toe(s) (acquired), left foot: Secondary | ICD-10-CM | POA: Diagnosis not present

## 2022-10-07 DIAGNOSIS — Z9889 Other specified postprocedural states: Secondary | ICD-10-CM

## 2022-10-07 NOTE — Progress Notes (Signed)
She presents today for follow-up of her [facility] bunion repair and second metatarsal osteotomy date of surgery was 08/27/2018 for states that she is doing quite well she states that she is tired of the Darco shoe and would like to get back into a regular shoe.  Objective: Vital signs are stable she is alert and oriented x 3 edema has decreased considerably she has fantastic range of motion very flexible at the first metatarsal phalangeal joint.  Very little in the way of scar tissue noted.  Second MTPJ is nontender and she is got good range of motion of that joint as well.  Radiographs taken today demonstrate internal fixation is intact though the screw to the capital osteotomy of the first metatarsal has backed out a little bit but does not feel prominent on the skin.  Assessment well-healing surgical foot status post 6 weeks.  Plan: I will to follow-up with her in about 1 month for another set of x-rays unguinal allow her to start getting more activity in a regular tennis shoe she is not to walk barefoot no squatting and no going up on her toes.  She understands this is amenable to it I will follow-up with her in 1 month

## 2022-11-04 ENCOUNTER — Ambulatory Visit: Payer: Medicare PPO

## 2022-11-04 ENCOUNTER — Ambulatory Visit: Payer: Medicare PPO | Admitting: Podiatry

## 2022-11-04 ENCOUNTER — Encounter: Payer: Self-pay | Admitting: Podiatry

## 2022-11-04 DIAGNOSIS — Z9889 Other specified postprocedural states: Secondary | ICD-10-CM

## 2022-11-04 DIAGNOSIS — M205X2 Other deformities of toe(s) (acquired), left foot: Secondary | ICD-10-CM

## 2022-11-04 NOTE — Progress Notes (Signed)
She presents today with her husband date of surgery 08/27/2018 for postop [facility] bunion repair and second metatarsal osteotomy.  Toes feel much better she says better than they were doing before surgery.  Objective: Vital signs are stable alert oriented x 3.  There is still some mild edema in the first intermetatarsal space no erythema cellulitis drainage odor no signs of infection.  Radiographs taken today demonstrate internal fixation in good position bone has not moved joint alignment is good  Assessment well-healing surgical foot left.  Plan: Follow-up with me on an as-needed basis.

## 2022-11-16 DIAGNOSIS — G309 Alzheimer's disease, unspecified: Secondary | ICD-10-CM | POA: Diagnosis not present

## 2022-11-16 DIAGNOSIS — F419 Anxiety disorder, unspecified: Secondary | ICD-10-CM | POA: Diagnosis not present

## 2022-11-16 DIAGNOSIS — R7303 Prediabetes: Secondary | ICD-10-CM | POA: Diagnosis not present

## 2022-11-16 DIAGNOSIS — N183 Chronic kidney disease, stage 3 unspecified: Secondary | ICD-10-CM | POA: Diagnosis not present

## 2022-11-16 DIAGNOSIS — K219 Gastro-esophageal reflux disease without esophagitis: Secondary | ICD-10-CM | POA: Diagnosis not present

## 2022-11-16 DIAGNOSIS — Z0000 Encounter for general adult medical examination without abnormal findings: Secondary | ICD-10-CM | POA: Diagnosis not present

## 2022-11-16 DIAGNOSIS — I129 Hypertensive chronic kidney disease with stage 1 through stage 4 chronic kidney disease, or unspecified chronic kidney disease: Secondary | ICD-10-CM | POA: Diagnosis not present

## 2022-11-16 DIAGNOSIS — J309 Allergic rhinitis, unspecified: Secondary | ICD-10-CM | POA: Diagnosis not present

## 2022-11-16 DIAGNOSIS — E7800 Pure hypercholesterolemia, unspecified: Secondary | ICD-10-CM | POA: Diagnosis not present

## 2022-11-17 ENCOUNTER — Other Ambulatory Visit: Payer: Self-pay | Admitting: Physician Assistant

## 2022-11-17 DIAGNOSIS — Z13820 Encounter for screening for osteoporosis: Secondary | ICD-10-CM

## 2022-11-17 DIAGNOSIS — Z1231 Encounter for screening mammogram for malignant neoplasm of breast: Secondary | ICD-10-CM

## 2023-01-06 ENCOUNTER — Ambulatory Visit
Admission: RE | Admit: 2023-01-06 | Discharge: 2023-01-06 | Disposition: A | Discharge status: Home / Self Care | Payer: Medicare PPO | Source: Ambulatory Visit | Attending: Physician Assistant | Admitting: Physician Assistant

## 2023-01-06 DIAGNOSIS — Z1231 Encounter for screening mammogram for malignant neoplasm of breast: Secondary | ICD-10-CM

## 2023-01-14 DIAGNOSIS — F02A0 Dementia in other diseases classified elsewhere, mild, without behavioral disturbance, psychotic disturbance, mood disturbance, and anxiety: Secondary | ICD-10-CM | POA: Diagnosis not present

## 2023-01-14 DIAGNOSIS — G301 Alzheimer's disease with late onset: Secondary | ICD-10-CM | POA: Diagnosis not present

## 2023-05-31 ENCOUNTER — Ambulatory Visit (INDEPENDENT_AMBULATORY_CARE_PROVIDER_SITE_OTHER)

## 2023-05-31 ENCOUNTER — Encounter: Payer: Self-pay | Admitting: Podiatry

## 2023-05-31 ENCOUNTER — Ambulatory Visit: Admitting: Podiatry

## 2023-05-31 DIAGNOSIS — M7752 Other enthesopathy of left foot: Secondary | ICD-10-CM | POA: Diagnosis not present

## 2023-05-31 DIAGNOSIS — G5782 Other specified mononeuropathies of left lower limb: Secondary | ICD-10-CM

## 2023-05-31 MED ORDER — TRIAMCINOLONE ACETONIDE 40 MG/ML IJ SUSP
20.0000 mg | Freq: Once | INTRAMUSCULAR | Status: AC
  Administered 2023-05-31: 20 mg

## 2023-05-31 NOTE — Progress Notes (Signed)
 She presents today chief concern of pain to toes 3rd and 4th left.  She denies fever chills nausea vomiting muscle aches and pains states they feel like they are stuck together.  She is also getting numbness and tingling in the area as well.  Objective: Vital signs are stable alert oriented x 3.  She does have some lateral deviation of the 2nd and 3rd toes encroaching on the fourth toe.'s but she has pain and palpable neuroma to the third interdigital space of the left foot.  Radiographs taken today demonstrate osseously mature individual good bone mineralization.  Internal fixation 1st and 2nd metatarsals are intact.  Toes sits rectus first metatarsophalangeal joint lateral deviation of the second toe left foot.  This is resulting also in lateral deviation of the third toe.  No fractures no acute findings.  Assessment: Neuroma third interspace left.  Plan: Highly recommended injection today 10 mg Kenalog  5 mg Marcaine to the point of maximal tenderness.  Tolerated procedure well without complications.
